# Patient Record
Sex: Female | Born: 1960 | Race: White | Hispanic: No | Marital: Married | Smoking: Former smoker
Health system: Southern US, Community
[De-identification: ages and names within clinical notes are randomized; demographics above are authoritative.]

## PROBLEM LIST (undated history)

## (undated) DIAGNOSIS — M722 Plantar fascial fibromatosis: Secondary | ICD-10-CM

## (undated) DIAGNOSIS — C50919 Malignant neoplasm of unspecified site of unspecified female breast: Secondary | ICD-10-CM

## (undated) DIAGNOSIS — K219 Gastro-esophageal reflux disease without esophagitis: Secondary | ICD-10-CM

## (undated) DIAGNOSIS — Z923 Personal history of irradiation: Secondary | ICD-10-CM

## (undated) DIAGNOSIS — M8580 Other specified disorders of bone density and structure, unspecified site: Secondary | ICD-10-CM

## (undated) DIAGNOSIS — M1990 Unspecified osteoarthritis, unspecified site: Secondary | ICD-10-CM

## (undated) DIAGNOSIS — K30 Functional dyspepsia: Secondary | ICD-10-CM

## (undated) HISTORY — PX: MOUTH SURGERY: SHX715

## (undated) HISTORY — DX: Malignant neoplasm of unspecified site of unspecified female breast: C50.919

## (undated) HISTORY — DX: Plantar fascial fibromatosis: M72.2

## (undated) HISTORY — DX: Unspecified osteoarthritis, unspecified site: M19.90

## (undated) HISTORY — DX: Functional dyspepsia: K30

---
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
## 1988-08-10 HISTORY — PX: GANGLION CYST EXCISION: SHX1691

## 1998-12-06 ENCOUNTER — Ambulatory Visit (HOSPITAL_COMMUNITY): Admission: RE | Admit: 1998-12-06 | Discharge: 1998-12-06 | Payer: Self-pay | Admitting: Family Medicine

## 1999-12-05 ENCOUNTER — Ambulatory Visit (HOSPITAL_COMMUNITY): Admission: RE | Admit: 1999-12-05 | Discharge: 1999-12-05 | Payer: Self-pay | Admitting: Family Medicine

## 1999-12-05 ENCOUNTER — Encounter: Payer: Self-pay | Admitting: Family Medicine

## 2008-05-02 DIAGNOSIS — D239 Other benign neoplasm of skin, unspecified: Secondary | ICD-10-CM

## 2008-05-02 HISTORY — DX: Other benign neoplasm of skin, unspecified: D23.9

## 2015-07-24 ENCOUNTER — Ambulatory Visit: Payer: Self-pay

## 2015-07-24 ENCOUNTER — Ambulatory Visit (INDEPENDENT_AMBULATORY_CARE_PROVIDER_SITE_OTHER): Payer: BLUE CROSS/BLUE SHIELD | Admitting: Podiatry

## 2015-07-24 ENCOUNTER — Encounter: Payer: Self-pay | Admitting: Podiatry

## 2015-07-24 ENCOUNTER — Ambulatory Visit (INDEPENDENT_AMBULATORY_CARE_PROVIDER_SITE_OTHER): Payer: BLUE CROSS/BLUE SHIELD

## 2015-07-24 VITALS — BP 124/70 | HR 67 | Resp 18

## 2015-07-24 DIAGNOSIS — M722 Plantar fascial fibromatosis: Secondary | ICD-10-CM

## 2015-07-24 DIAGNOSIS — Q6650 Congenital pes planus, unspecified foot: Secondary | ICD-10-CM | POA: Diagnosis not present

## 2015-07-24 DIAGNOSIS — R52 Pain, unspecified: Secondary | ICD-10-CM

## 2015-07-24 MED ORDER — METHYLPREDNISOLONE 4 MG PO TBPK: ORAL_TABLET | ORAL | Status: DC

## 2015-07-24 MED ORDER — MELOXICAM 15 MG PO TABS: 15.0000 mg | ORAL_TABLET | Freq: Every day | ORAL | Status: DC

## 2015-07-24 NOTE — Progress Notes (Signed)
   Subjective:    Patient ID: Norma Austin, female    DOB: May 26, 1961, 54 y.o.   MRN: [id number]   HPI I HAVE SOME HEEL PAIN ON BOTH OF MY FEET AND GOING ON FOR ABOUT A YEAR BUT WORSE LAST FEW MONTHS AND MY RIGHT ANKLE IS STIFF    Review of Systems  All other systems reviewed and are negative.      Objective:   Physical Exam: She presents today as a 54 year old female vital signs stable alert and oriented 3 no apparent distress. Pulses are strongly palpable. Neurologic sensorium is intact per Semmes-Weinstein monofilament. Deep tendon reflexes are intact bilateral and muscle strength is 5 over 5 dorsiflexion plantar flexors inverters and everters all into the musculature is intact. Orthopedic evaluation demonstrates all joints distal to the ankle for range of motion without crepitus moderate pes planus is noted bilateral. She has pain on palpation medial calcaneal tubercles of bilateral heels. Radiographs taken today 3 views of bilateral foot does demonstrate soft tissue increase in density at the plantar fascial calcaneal insertion site consistent with plantar fasciitis as well as pes planus. Cutaneous evaluation demonstrates supple well-hydrated cutis no erythema edema cellulitis drainage or odor.      Assessment & Plan:  Assessment: Plantar fasciitis and pes planus bilateral.  Plan: Discussed etiology pathology conservative versus surgical therapies. Injected the bilateral heels today with Kenalog and local anesthetic. Placed her in a plantar fascial brace as well as a night splint. Also started her on a Medrol Dosepak to be followed by meloxicam. And she was scanned for set of orthotics.

## 2015-08-30 ENCOUNTER — Ambulatory Visit (INDEPENDENT_AMBULATORY_CARE_PROVIDER_SITE_OTHER): Payer: BLUE CROSS/BLUE SHIELD | Admitting: *Deleted

## 2015-08-30 DIAGNOSIS — M722 Plantar fascial fibromatosis: Secondary | ICD-10-CM

## 2015-08-30 NOTE — Patient Instructions (Signed)
WEARING INSTRUCTIONS FOR ORTHOTICS  Don't expect to be comfortable wearing your orthotic devices for the first time.  Like eyeglasses, you may be aware of them as time passes, they will not be uncomfortable and you will enjoy wearing them.  FOLLOW THESE INSTRUCTIONS EXACTLY!  1. Wear your orthotic devices for:       Not more than 1 hour the first day.       Not more than 2 hours the second day.       Not more than 3 hours the third day and so on.        Or wear them for as long as they feel comfortable.       If you experience discomfort in your feet or legs take them out.  When feet & legs feel       better, put them back in.  You do need to be consistent and wear them a little        everyday. 2.   If at any time the orthotic devices become acutely uncomfortable before the       time for that particular day, STOP WEARING THEM. 3.   On the next day, do not increase the wearing time. 4.   Subsequently, increase the wearing time by 15-30 minutes only if comfortable to do       so. 5.   You will be seen by your doctor about 2-4 weeks after you receive your orthotic       devices, at which time you will probably be wearing your devices comfortably        for about 8 hours or more a day. 6.   Some patients occasionally report mild aches or discomfort in other parts of the of       body such as the knees, hips or back after 3 or 4 consecutive hours of wear.  If this       is the case with you, do not extend your wearing time.  Instead, cut it back an hour or       two.  In all likelihood, these symptoms will disappear in a short period of time as your       body posture realigns itself and functions more efficiently. 7.   It is possible that your orthotic device may require some small changes or adjustment       to improve their function or make them more comfortable.   This is usually not done       before one to three months have elapsed.  These adjustments are made in        accordance  with the changed position your feet are assuming as a result of       improved biomechanical function. 8.   In women's shoes, it's not unusual for your heel to slip out of the [place], particularly if       they are step-in-shoes.  If this is the case, try other shoes or other styles.  Try to       purchase shoes which have deeper heal seats or higher heel counters. 9.   Squeaking of orthotics devices in the shoes is due to the movement of the devices       when they are functioning normally.  To eliminate squeaking, simply dust some       baby powder into your shoes before inserting the devices.  If this does not work,          apply soap or wax to the edges of the orthotic devices or put a tissue into the shoes. 10. It is important that you follow these directions explicitly.  Failure to do so will simply       prolong the adjustment period or create problems which are easily avoided.  It makes       no difference if you are wearing your orthotic devices for only a few hours after        several months, so long as you are wearing them comfortably for those hours. 11. If you have any questions or complaints, contact our office.  We have no way of       knowing about your problems unless you tell us.  If we do not hear from you, we will       assume that you are proceeding well.  

## 2015-08-30 NOTE — Progress Notes (Signed)
Patient presents to pick up orthotics. Instructions for wear was reviewed and written copy was given. Follow up appointment will be made to see Dr. Milinda Pointer in 6 weeks.

## 2015-10-11 ENCOUNTER — Ambulatory Visit: Payer: BLUE CROSS/BLUE SHIELD | Admitting: Sports Medicine

## 2015-10-21 ENCOUNTER — Ambulatory Visit (INDEPENDENT_AMBULATORY_CARE_PROVIDER_SITE_OTHER): Payer: BLUE CROSS/BLUE SHIELD | Admitting: Podiatry

## 2015-10-21 ENCOUNTER — Encounter: Payer: Self-pay | Admitting: Podiatry

## 2015-10-21 VITALS — BP 136/82 | HR 72 | Resp 12

## 2015-10-21 DIAGNOSIS — M722 Plantar fascial fibromatosis: Secondary | ICD-10-CM | POA: Diagnosis not present

## 2015-10-21 NOTE — Progress Notes (Signed)
She presents today for follow-up of her orthotics. She states that she is still currently having heel pain and that the orthotics appear to be too narrow for her foot resulting in rubbing and irritation and skin breakdown.  Objective: Vital signs are stable she is alert and oriented 3. States that the pain in her heels is approximately 30% improved. She has pain on palpation medial calcaneal tubercles bilateral severe pes planus with narrow orthotics resulting and reactive hyperkeratotic lesion along the medial longitudinal arch. No open lesions and no skin breakdown.  Assessment: Pes planus with plantar fasciitis bilateral. Narrow orthotics resulting in callous medial longitudinal arch.  Plan: Debridement of reactive hyperkeratotic tissue today reinjected the bilateral heels for her. I also sent her orthotics out for an addition of a medial flange.

## 2016-08-10 DIAGNOSIS — C50919 Malignant neoplasm of unspecified site of unspecified female breast: Secondary | ICD-10-CM

## 2016-08-10 DIAGNOSIS — Z923 Personal history of irradiation: Secondary | ICD-10-CM

## 2016-08-10 HISTORY — PX: BREAST LUMPECTOMY: SHX2

## 2016-08-10 HISTORY — DX: Personal history of irradiation: Z92.3

## 2016-08-10 HISTORY — DX: Malignant neoplasm of unspecified site of unspecified female breast: C50.919

## 2016-12-30 ENCOUNTER — Other Ambulatory Visit: Payer: Self-pay | Admitting: Physician Assistant

## 2016-12-30 DIAGNOSIS — Z1231 Encounter for screening mammogram for malignant neoplasm of breast: Secondary | ICD-10-CM

## 2017-01-28 ENCOUNTER — Ambulatory Visit
Admission: RE | Admit: 2017-01-28 | Discharge: 2017-01-28 | Disposition: A | Discharge status: Home / Self Care | Payer: BLUE CROSS/BLUE SHIELD | Source: Ambulatory Visit | Attending: Physician Assistant | Admitting: Physician Assistant

## 2017-01-28 DIAGNOSIS — Z1231 Encounter for screening mammogram for malignant neoplasm of breast: Secondary | ICD-10-CM | POA: Diagnosis not present

## 2017-01-28 DIAGNOSIS — R928 Other abnormal and inconclusive findings on diagnostic imaging of breast: Secondary | ICD-10-CM | POA: Diagnosis not present

## 2017-02-04 ENCOUNTER — Other Ambulatory Visit: Payer: Self-pay | Admitting: Physician Assistant

## 2017-02-04 DIAGNOSIS — R928 Other abnormal and inconclusive findings on diagnostic imaging of breast: Secondary | ICD-10-CM

## 2017-02-04 DIAGNOSIS — N6320 Unspecified lump in the left breast, unspecified quadrant: Secondary | ICD-10-CM

## 2017-02-08 ENCOUNTER — Ambulatory Visit
Admission: RE | Admit: 2017-02-08 | Discharge: 2017-02-08 | Disposition: A | Discharge status: Home / Self Care | Payer: BLUE CROSS/BLUE SHIELD | Source: Ambulatory Visit | Attending: Physician Assistant | Admitting: Physician Assistant

## 2017-02-08 DIAGNOSIS — R928 Other abnormal and inconclusive findings on diagnostic imaging of breast: Secondary | ICD-10-CM

## 2017-02-08 DIAGNOSIS — N6320 Unspecified lump in the left breast, unspecified quadrant: Secondary | ICD-10-CM

## 2017-02-08 DIAGNOSIS — N6321 Unspecified lump in the left breast, upper outer quadrant: Secondary | ICD-10-CM | POA: Insufficient documentation

## 2017-02-11 ENCOUNTER — Other Ambulatory Visit: Payer: Self-pay | Admitting: Physician Assistant

## 2017-02-11 DIAGNOSIS — N6320 Unspecified lump in the left breast, unspecified quadrant: Secondary | ICD-10-CM

## 2017-02-11 DIAGNOSIS — R928 Other abnormal and inconclusive findings on diagnostic imaging of breast: Secondary | ICD-10-CM

## 2017-02-24 ENCOUNTER — Ambulatory Visit
Admission: RE | Admit: 2017-02-24 | Discharge: 2017-02-24 | Disposition: A | Discharge status: Home / Self Care | Payer: BLUE CROSS/BLUE SHIELD | Source: Ambulatory Visit | Attending: Physician Assistant | Admitting: Physician Assistant

## 2017-02-24 DIAGNOSIS — R928 Other abnormal and inconclusive findings on diagnostic imaging of breast: Secondary | ICD-10-CM

## 2017-02-24 DIAGNOSIS — N6320 Unspecified lump in the left breast, unspecified quadrant: Secondary | ICD-10-CM

## 2017-02-24 DIAGNOSIS — C50412 Malignant neoplasm of upper-outer quadrant of left female breast: Secondary | ICD-10-CM | POA: Insufficient documentation

## 2017-02-24 HISTORY — PX: BREAST BIOPSY: SHX20

## 2017-02-25 NOTE — Progress Notes (Signed)
  Oncology Nurse Navigator Documentation  Navigator Location: [facility] (02/25/17 1600)   )Navigator Encounter Type: Introductory phone call (02/25/17 1600)                     Patient Visit Type: Initial (02/25/17 1600)                              Time Spent with Patient: 15 (02/25/17 1600)  Phoned patient to introduce Navigation service.  Patient requests to be scheduled with Dr. Jamal Collin.  Office closed. Will schedule on Monday 03/01/17.  Request sent to schedulers for Oncology consult.

## 2017-03-01 NOTE — Progress Notes (Signed)
  Oncology Nurse Navigator Documentation  Navigator Location: [facility] (03/01/17 0900) Referral date to RadOnc/MedOnc: 03/03/17 (03/01/17 0900) )Navigator Encounter Type: Telephone (03/01/17 0900) Telephone: Lahoma Crocker Call;Appt Confirmation/Clarification (03/01/17 0900)                     Treatment Phase: Pre-Tx/Tx Discussion (03/01/17 0900) Barriers/Navigation Needs: Coordination of Care;Education (03/01/17 0900) Education: Accessing Care/ Finding Providers;Coping with Diagnosis/ Prognosis;Newly Diagnosed Cancer Education (03/01/17 0900)                        Time Spent with Patient: 15 (03/01/17 0900)   Confirmed Oncology Consult, and scheduled Surgical Consult with patient.  Both are scheduled 03/03/17.

## 2017-03-02 ENCOUNTER — Encounter: Payer: Self-pay | Admitting: *Deleted

## 2017-03-03 ENCOUNTER — Ambulatory Visit (INDEPENDENT_AMBULATORY_CARE_PROVIDER_SITE_OTHER): Payer: BLUE CROSS/BLUE SHIELD | Admitting: General Surgery

## 2017-03-03 ENCOUNTER — Inpatient Hospital Stay: Payer: BLUE CROSS/BLUE SHIELD | Attending: Oncology | Admitting: Oncology

## 2017-03-03 ENCOUNTER — Encounter: Payer: Self-pay | Admitting: Oncology

## 2017-03-03 ENCOUNTER — Inpatient Hospital Stay: Payer: BLUE CROSS/BLUE SHIELD

## 2017-03-03 ENCOUNTER — Encounter: Payer: Self-pay | Admitting: General Surgery

## 2017-03-03 VITALS — BP 130/84 | HR 66 | Temp 97.6°F | Wt 245.7 lb

## 2017-03-03 VITALS — BP 102/70 | HR 80 | Resp 14 | Ht 67.0 in | Wt 245.0 lb

## 2017-03-03 DIAGNOSIS — Z170 Estrogen receptor positive status [ER+]: Secondary | ICD-10-CM

## 2017-03-03 DIAGNOSIS — C50412 Malignant neoplasm of upper-outer quadrant of left female breast: Secondary | ICD-10-CM | POA: Diagnosis not present

## 2017-03-03 DIAGNOSIS — Z803 Family history of malignant neoplasm of breast: Secondary | ICD-10-CM | POA: Insufficient documentation

## 2017-03-03 DIAGNOSIS — K30 Functional dyspepsia: Secondary | ICD-10-CM | POA: Insufficient documentation

## 2017-03-03 DIAGNOSIS — Z79899 Other long term (current) drug therapy: Secondary | ICD-10-CM | POA: Diagnosis not present

## 2017-03-03 DIAGNOSIS — M129 Arthropathy, unspecified: Secondary | ICD-10-CM | POA: Diagnosis not present

## 2017-03-03 DIAGNOSIS — C50912 Malignant neoplasm of unspecified site of left female breast: Secondary | ICD-10-CM

## 2017-03-03 LAB — COMPREHENSIVE METABOLIC PANEL
ALBUMIN: 4.2 g/dL (ref 3.5–5.0)
ALT: 34 U/L (ref 14–54)
ANION GAP: 7 (ref 5–15)
AST: 23 U/L (ref 15–41)
Alkaline Phosphatase: 56 U/L (ref 38–126)
BUN: 13 mg/dL (ref 6–20)
CHLORIDE: 103 mmol/L (ref 101–111)
CO2: 24 mmol/L (ref 22–32)
Calcium: 9.2 mg/dL (ref 8.9–10.3)
Creatinine, Ser: 0.73 mg/dL (ref 0.44–1.00)
GFR calc non Af Amer: >60 mL/min (ref >60)
GLUCOSE: 98 mg/dL (ref 65–99)
Potassium: 4.2 mmol/L (ref 3.5–5.1)
SODIUM: 134 mmol/L — AB (ref 135–145)
Total Bilirubin: 0.6 mg/dL (ref 0.3–1.2)
Total Protein: 7.7 g/dL (ref 6.5–8.1)

## 2017-03-03 LAB — CBC WITH DIFFERENTIAL/PLATELET
Basophils Absolute: 0.1 10*3/uL (ref 0–0.1)
Basophils Relative: 2 %
Eosinophils Absolute: 0.1 10*3/uL (ref 0–0.7)
Eosinophils Relative: 2 %
HEMATOCRIT: 41.6 % (ref 35.0–47.0)
HEMOGLOBIN: 14.6 g/dL (ref 12.0–16.0)
LYMPHS ABS: 1.6 10*3/uL (ref 1.0–3.6)
Lymphocytes Relative: 30 %
MCH: 30.6 pg (ref 26.0–34.0)
MCHC: 35 g/dL (ref 32.0–36.0)
MCV: 87.3 fL (ref 80.0–100.0)
MONOS PCT: 7 %
Monocytes Absolute: 0.4 10*3/uL (ref 0.2–0.9)
NEUTROS ABS: 3.2 10*3/uL (ref 1.4–6.5)
NEUTROS PCT: 59 %
Platelets: 204 10*3/uL (ref 150–440)
RBC: 4.77 MIL/uL (ref 3.80–5.20)
RDW: 13.5 % (ref 11.5–14.5)
WBC: 5.3 10*3/uL (ref 3.6–11.0)

## 2017-03-03 NOTE — Progress Notes (Signed)
  Oncology Nurse Navigator Documentation  Navigator Location: [facility] (03/03/17 1500)   )Navigator Encounter Type: Initial MedOnc (03/03/17 1500)                       Treatment Phase: Pre-Tx/Tx Discussion (03/03/17 1500) Barriers/Navigation Needs: Education;Coordination of Care (03/03/17 1500) Education: Newly Diagnosed Cancer Education;Understanding Cancer/ Treatment Options (03/03/17 1500) Interventions: Education;Coordination of Care (03/03/17 1500)   Coordination of Care: Appts (03/03/17 1500) Education Method: Verbal;Written;Teach-back (03/03/17 1500)                Time Spent with Patient: 60 (03/03/17 1500)   Accompanied patient, wife to first Med/Onc visit with Dr. Tasia Catchings.  Given Breast Cancer Treatment Handbook/folder with hospital services.  Sent genetic testing to [facility].  She has appointment with Dr. Jamal Collin at 4:30 today.

## 2017-03-03 NOTE — Progress Notes (Signed)
Patient ID: Norma Austin, female   DOB: 03/10/1961, 56 y.o.   MRN: [id number]  Chief Complaint  Patient presents with  . Breast Problem    HPI Norma Austin is a 56 y.o. female.  who presents for a breast evaluation. The most recent mammogram with left breast biopsy was done on 02-24-17.  Patient does perform regular self breast checks but since she changed jobs had not had regular mammograms done.  She could not feel anything different in the breast. Mother with breast cancer at age 81. She is here with her significant other(wife) ,Norma Austin.   HPI  Past Medical History:  Diagnosis Date  . Arthritis   . Breast cancer (HCC) 2018   INVASIVE MAMMARY CARCINOMA  . Indigestion   . Plantar fasciitis     Past Surgical History:  Procedure Laterality Date  . BREAST BIOPSY Left 02/24/2017   INVASIVE MAMMARY CARCINOMA  . GANGLION CYST EXCISION  1990    Family History  Problem Relation Age of Onset  . Breast cancer Mother 81  . Lung cancer Father   . Thyroid cancer Cousin   . Colon cancer Neg Hx     Social History Social History  Substance Use Topics  . Smoking status: Never Smoker  . Smokeless tobacco: Never Used  . Alcohol use 0.0 oz/week     Comment: occasional    No Known Allergies  Current Outpatient Prescriptions  Medication Sig Dispense Refill  . diclofenac (VOLTAREN) 50 MG EC tablet Take 50 mg by mouth as needed.      No current facility-administered medications for this visit.     Review of Systems Review of Systems  Constitutional: Negative.   Respiratory: Negative.   Cardiovascular: Negative.     Blood pressure 102/70, pulse 80, resp. rate 14, height 5' 7" (1.702 m), weight 245 lb (111.1 kg).  Physical Exam Physical Exam  Constitutional: She is oriented to person, place, and time. She appears well-developed and well-nourished.  HENT:  Mouth/Throat: Oropharynx is clear and moist.  Eyes: Conjunctivae are normal. No scleral icterus.  Neck: Neck  supple.  Cardiovascular: Normal rate, regular rhythm and normal heart sounds.   Pulmonary/Chest: Effort normal and breath sounds normal. Right breast exhibits no inverted nipple, no mass, no nipple discharge, no skin change and no tenderness. Left breast exhibits no inverted nipple, no mass, no nipple discharge, no skin change and no tenderness. Breasts are symmetrical.    Abdominal: Soft. There is no tenderness.  Lymphadenopathy:    She has no cervical adenopathy.    She has no axillary adenopathy.  Neurological: She is alert and oriented to person, place, and time.  Skin: Skin is warm and dry.  Psychiatric: She has a normal mood and affect.    Data Reviewed  Mammogram and pathology 1.2 cm spiculated mass left breast UOQ. Atypical cyst in adjacent location disaapeared at time of core biopsy. 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 Genetic testing in progress. Discussed surgical options. Ideal for lumpectomy and SN biopsy.    No need for MRI Plan   Pt is agreeable to discussing surgical options further once HER2 results are available  Briefly explained procedure of lumpectomy and sentinel node biopsy.      HPI, Physical Exam, Assessment and Plan have been scribed under the direction and in the presence of S. G. Dalisha Shively, MD Marsha Hatch,   RN I have completed the exam and reviewed the above documentation for accuracy and completeness.  I agree with the above.  [employer] has been used and any errors in dictation or transcription are unintentional.  Navayah Sok G. Eldor Conaway, M.D., F.A.C.S.  Margan Elias G 03/05/2017, 9:10 AM   

## 2017-03-03 NOTE — Patient Instructions (Addendum)
Await HER2 results, will receive phone call  The patient is aware to call back for any questions or concerns.

## 2017-03-03 NOTE — Progress Notes (Signed)
[facility] Consultation Note  Patient Care Team: Sifferman, Grafton Folk as PCP - General (Physician Assistant) [name], MD as Referring Physician (Family Medicine) [name], MD (General Surgery)  CHIEF COMPLAINTS/PURPOSE OF CONSULTATION: I have breast cancer  HISTORY OF PRESENTING ILLNESS: Norma Austin 56 y.o. female with PMH listed as below is referred by Dr.Cook to cancer center for evaluation and management of newly diagnosed breast caner.  Patient had routine mammogram screening which showed left breast mass. This was followed by a diagnostic Mammogram and ultrasound which showed an hypoechoic mass measuring 12 x 7 x 10 mm at 1:30, 7 cm from the nipple, corresponding to the spiculated mass seen mammographically. There is an adjacent near anechoic mass at 1 o'clock, 6 cm from the nipple measuring 6 x 4 x 4 mm. 1:30 mass was biopsy and showed invasive carcinoma of breast, unspecified type, no LVI, No DCIS. The 1 o'clock mass disappeared after biopsy of the 1:30 mass. It maybe a cyst which was poked by the needle for anesthesia.   Today patient came with her significant other to discuss about diagnosis and plan. She has no complaints at this point.  Review of Systems  Constitutional: Negative.   HENT:  Negative.   Eyes: Negative.   Respiratory: Negative.   Cardiovascular: Negative.   Gastrointestinal: Negative.   Endocrine: Negative.   Genitourinary: Negative.    Musculoskeletal: Positive for arthralgias.  Skin: Negative.   Neurological: Negative.   Hematological: Negative.   Psychiatric/Behavioral: Negative.     MEDICAL HISTORY: Past Medical History:  Diagnosis Date  . Arthritis   . Breast cancer ([place])   . Indigestion   . Plantar fasciitis     SURGICAL HISTORY: Past Surgical History:  Procedure Laterality Date  . BREAST BIOPSY      SOCIAL HISTORY: Social History   Social History  . Marital status: Married    Spouse  name: N/A  . Number of children: N/A  . Years of education: N/A   Occupational History  . Not on file.   Social History Main Topics  . Smoking status: Never Smoker  . Smokeless tobacco: Never Used  . Alcohol use No  . Drug use: No  . Sexual activity: Not on file   Other Topics Concern  . Not on file   Social History Narrative  . No narrative on file    FAMILY HISTORY Family History  Problem Relation Age of Onset  . Breast cancer Mother 68    ALLERGIES:  has No Known Allergies.  MEDICATIONS:  Current Outpatient Prescriptions  Medication Sig Dispense Refill  . diclofenac (VOLTAREN) 50 MG EC tablet      No current facility-administered medications for this visit.     PHYSICAL EXAMINATION:  ECOG PERFORMANCE STATUS: 0 - Asymptomatic   Vitals:   03/03/17 1018  BP: 130/84  Pulse: 66  Temp: 97.6 F (36.4 C)    Filed Weights   03/03/17 1018  Weight: 245 lb 11.2 oz (111.4 kg)     Physical Exam GENERAL: No distress, well nourished.  SKIN:  No rashes or significant lesions  HEAD: Normocephalic, No masses, lesions, tenderness or abnormalities  EYES: Conjunctiva are pink, non icteric ENT: External ears normal ,lips , buccal mucosa, and tongue normal and mucous membranes are moist  LYMPH: No palpable cervical and axillary lymphadenopathy  LUNGS: Clear to auscultation, no crackles or wheezes HEART: Regular rate & rhythm, no murmurs, no gallops, S1 normal and  S2 normal  ABDOMEN: Abdomen soft, non-tender, normal bowel sounds, I did not appreciate any  masses or organomegaly  MUSCULOSKELETAL: No CVA tenderness and no tenderness on percussion of the back or rib cage.  EXTREMITIES: No edema, no skin discoloration or tenderness NEURO: Alert & oriented, no focal motor/sensory deficits. Breasts: Symmetric, left breast skin bruise from recent biopsy. Left upper outer quadrant mass (+),  no skin or nipple changes no palpable axillary lymph nodes.   LABORATORY DATA: I  have personally reviewed the data as listed: Pathology 7/18  Surgical Pathology   THIS IS AN ADDENDUM REPORT  CASE: [phone]  PATIENT: [facility]  Surgical Pathology Report  Reason for Addendum #1: Immunohistochemistry results   SPECIMEN SUBMITTED:  A. Breast, left, UOQ, 1:30   CLINICAL HISTORY:  Spiculated mass, 12 mm   PRE-OPERATIVE DIAGNOSIS:  Suspect IMC   POST-OPERATIVE DIAGNOSIS:  None provided.   DIAGNOSIS:  A. BREAST, LEFT, UPPER OUTER QUADRANT AT 1:30, 7 CM FROM NIPPLE;  ULTRASOUND-GUIDED CORE BIOPSY:  - INVASIVE MAMMARY CARCINOMA, NO SPECIAL TYPE.   Size of invasive carcinoma: 10 mm in this sample  Histologic grade of invasive carcinoma: Grade 1    Glandular/tubular differentiation score: 1    Nuclear pleomorphism score: 2    Mitotic rate score: 1    Total score: 4   Ductal carcinoma in situ: Not identified  Lymphovascular invasion: Not identified  ER/PR/HER2: Immunohistochemistry will be performed on block A1, with  reflex to [place] for HER2 2+. The results will be reported in an addendum.   ADDENDUM:  NOTE: HER2 immunohistochemistry result is equivocal (2+) and FISH  testing has been ordered. The result will be reported in an addendum.   BREAST BIOMARKER TEST RESULTS:  Estrogen Receptor (ER) Status: POSITIVE, >90% of cells with nuclear  positivity     Average intensity of staining: Strong  Progesterone Receptor (PgR) Status: POSITIVE, >90% of cells with nuclear  positivity     Average intensity of staining: Strong  HER2 (by immunohistochemistry): Equivocal (2+)  Percentage of cells with uniform intense complete membrane staining: 0%   METHODS  Cold Ischemia and Fixation Times: Meet requirements specified in latest  version of the ASCO/CAP guidelines  Testing Performed on Block Number(s): A1  Fixative: Formalin  Estrogen Receptor: FDA cleared ([place]); Primary Antibody:SP1  Progesterone Receptor: FDA cleared ([place]);  Primary Antibody: 1E2  HER2 (immunohistochemistry): FDA approved ([place]); HercepTest   Immunohistochemistry controls worked appropriately.  Slides were prepared by [facility] Molecular Biology and  Pathology, [place], [place], and interpreted by Dr. Dicie Beam.   Addendum #1 performed by Bryan Lemma, MD. Electronically signed  03/01/2017 7:53:17PM   RADIOGRAPHIC STUDIES: I have personally reviewed the radiological images as listed and agree with the findings in the report 01/28/2017 Mammogram screening FINDINGS: In the left breast, a possible mass warrants further evaluation. In the right breast, no findings suspicious for malignancy. IMPRESSION: Further evaluation is suggested for possible mass in the left breast. 02/08/2017 Mammogram diagnostic left IMPRESSION: 1. Highly suspicious left breast mass at 1:30, 7 cm from the nipple. 2. Indeterminate mass at 1 o'clock, 6 cm from the nipple.  02/08/2017 2D DIGITAL DIAGNOSTIC LEFT MAMMOGRAM WITH CAD AND ADJUNCT TOMO ULTRASOUND LEFT BREAST COMPARISON:  Previous exam(s). ACR Breast Density Category b: There are scattered areas of fibroglandular density. FINDINGS: Additional views of the left breast demonstrates a persistent spiculated mass in the upper, outer left breast, corresponding to the mass seen on screening mammogram. There is a smaller,  adjacent oval mass within the slightly more medial upper, outer left breast. Mammographic images were processed with CAD. On physical exam, there is palpable thickening at 1:30, 7 cm from the nipple in the area of concern. Ultrasound demonstrates an irregular, hypoechoic mass measuring 12 x 7 x 10 mm at 1:30, 7 cm from the nipple, corresponding to the spiculated mass seen mammographically. There is an adjacent near anechoic mass at 1 o'clock, 6 cm from the nipple measuring 6 x 4 x 4 mm, corresponding to the additional mass seen mammographically. Ultrasound of the left axilla demonstrates no suspicious  appearing axillary lymph nodes. IMPRESSION: 1. Highly suspicious left breast mass at 1:30, 7 cm from the nipple. 2. Indeterminate mass at 1 o'clock, 6 cm from the nipple. EXAM: ULTRASOUND GUIDED LEFT BREAST CORE NEEDLE BIOPSY COMPARISON:  Previous exam(s). FINDINGS: I met with the patient and we discussed the procedure of ultrasound-guided biopsy, including benefits and alternatives. We discussed the high likelihood of a successful procedure. We discussed the risks of the procedure, including infection, bleeding, tissue injury, clip migration, and inadequate sampling. Informed written consent was given. The usual time-out protocol was performed immediately prior to the procedure.  Lesion quadrant: Upper outer quadrant  Using sterile technique and 1% Lidocaine as local anesthetic, under direct ultrasound visualization, a 12 gauge spring-loaded device was used to perform biopsy of the highly suspicious mass in the 1:30 o'clock position using an inferior approach. At the conclusion of the procedure a coil shaped tissue marker clip was deployed into the biopsy cavity. Follow up 2 view mammogram was performed and dictated separately.  Following the local anesthesia, the indeterminate mass, that was localized at the 1 o'clock position, could no longer be visualized. This appears to have been disrupted with the local anesthesia needle or possibly obscured by the infiltrated lidocaine. This strongly supports this lesion being benign cyst or dilated duct. IMPRESSION: Ultrasound guided biopsy of a left breast mass. No apparent Complications.  ASSESSMENT/PLAN Cancer Staging Malignant neoplasm of upper-outer quadrant of left breast in female, estrogen receptor positive ([place]) Staging form: Breast, AJCC 8th Edition - Clinical stage from 03/03/2017: Stage IA (cT1c, cN0, cM0, G1, ER: Positive, PR: Positive, HER2: Equivocal) - Signed by Earlie Server, MD on 03/03/2017  1. Malignant neoplasm of  upper-outer quadrant of left breast in female, estrogen receptor positive ([place])   2. Er+ PR+ carcinoma of breast, left (HCC)            HER2 IHC equivocal.  # Clinically patient has Stage IA breast cancer. Discussed with patient and her significant other about the cancer diagnosis.  Awaiting HER2 FISH to finalize treatment plan with adjustment after her pathology from resection. .   Likely lumpectomy with sentinel lymph node biopsy + RT, + adjuvant AI +/- adjuvant chemotherapy.  She has positive family history of breast cancer, including her mother with hormone receptor positive breast cancer at age of 18, Aunt questionable breast cancer, mother's aunt has breast surgery for questionable breast cancer. In addition, one maternal cousin with thyroid cancer. Will send genetic test and she may need genetic counseling if test positive. Prefer to know these information pre-OP as it may change surgical decision.   # The second mass disappear US guided biopsy for the first mass, questionable cyst. I am leaning towards obtaining MRI breast to further evaluate the second mass, and also if any other multifocal masses. Appreciate input from Dr.Sankar regarding the need of MRI.    Orders Placed This Encounter  Procedures  . Comprehensive metabolic panel    Standing Status:   Future    Standing Expiration Date:   03/03/2018  . CBC with Differential/Platelet    Standing Status:   Future    Standing Expiration Date:   03/03/2018    All questions were answered. The patient knows to call the clinic with any problems, questions or concerns. Thank you for this kind referral and the opportunity to participate in the care of this patient. A copy of today's note will be routed to referring physician Dr Lacinda Axon. Return after surgery.    Earlie Server MD PhD [facility] On[facility] [phone] Phone: [phone] 02/22/2017

## 2017-03-05 LAB — SURGICAL PATHOLOGY

## 2017-03-08 ENCOUNTER — Telehealth: Payer: Self-pay | Admitting: *Deleted

## 2017-03-08 ENCOUNTER — Other Ambulatory Visit: Payer: Self-pay | Admitting: *Deleted

## 2017-03-08 DIAGNOSIS — C50412 Malignant neoplasm of upper-outer quadrant of left female breast: Secondary | ICD-10-CM

## 2017-03-08 DIAGNOSIS — Z170 Estrogen receptor positive status [ER+]: Principal | ICD-10-CM

## 2017-03-08 NOTE — Telephone Encounter (Signed)
Message left for patient to call the office.   Per Dr. Jamal Collin, he was waiting for the [facility] report to come back and it is okay.   He would like to discuss this with the patient as well as a lumpectomy. We need to see if patient can come in on Tuesday, 03-09-17 or Thursday, 03-11-17 to further discuss.   Patient's surgery date can go ahead and be arranged if patient wants to go ahead and get it scheduled.

## 2017-03-08 NOTE — Telephone Encounter (Signed)
Patient called back and was notified as instructed.   Pre-op visit has been scheduled for this Thursday as she already has the day off work.   Patient's surgery has been scheduled for 03-18-17 at [facility]. We will review instructions at patient's pre-op visit on Thursday. She verbalizes understanding.

## 2017-03-10 HISTORY — PX: BREAST MAMMOSITE: SHX5264

## 2017-03-11 ENCOUNTER — Encounter: Payer: Self-pay | Admitting: General Surgery

## 2017-03-11 ENCOUNTER — Ambulatory Visit (INDEPENDENT_AMBULATORY_CARE_PROVIDER_SITE_OTHER): Payer: BLUE CROSS/BLUE SHIELD | Admitting: General Surgery

## 2017-03-11 ENCOUNTER — Other Ambulatory Visit: Payer: Self-pay | Admitting: *Deleted

## 2017-03-11 ENCOUNTER — Encounter
Admission: RE | Admit: 2017-03-11 | Discharge: 2017-03-11 | Disposition: A | Discharge status: Home / Self Care | Payer: BLUE CROSS/BLUE SHIELD | Source: Ambulatory Visit | Attending: General Surgery | Admitting: General Surgery

## 2017-03-11 VITALS — BP 118/78 | HR 78 | Resp 12 | Ht 67.0 in | Wt 246.0 lb

## 2017-03-11 DIAGNOSIS — C50412 Malignant neoplasm of upper-outer quadrant of left female breast: Secondary | ICD-10-CM | POA: Diagnosis not present

## 2017-03-11 DIAGNOSIS — Z170 Estrogen receptor positive status [ER+]: Secondary | ICD-10-CM

## 2017-03-11 HISTORY — DX: Gastro-esophageal reflux disease without esophagitis: K21.9

## 2017-03-11 NOTE — Patient Instructions (Signed)
  Your procedure is scheduled on: 03-18-17 Report to [facility] (2ND DESK ON RIGHT)  Remember: Instructions that are not followed completely may result in serious medical risk, up to and including death, or upon the discretion of your surgeon and anesthesiologist your surgery may need to be rescheduled.    _x___ 1. Do not eat food or drink liquids after midnight. No gum chewing or  hard candies.     __x__ 2. No Alcohol for 24 hours before or after surgery.   __x__3. No Smoking for 24 prior to surgery.   ____  4. Bring all medications with you on the day of surgery if instructed.    __x__ 5. Notify your doctor if there is any change in your medical condition     (cold, fever, infections).     Do not wear jewelry, make-up, hairpins, clips or nail polish.  Do not wear lotions, powders, or perfumes. You may wear deodorant.  Do not shave 48 hours prior to surgery. Men may shave face and neck.  Do not bring valuables to the hospital.    [facility] is not responsible for any belongings or valuables.               Contacts, dentures or bridgework may not be worn into surgery.  Leave your suitcase in the car. After surgery it may be brought to your room.  For patients admitted to the hospital, discharge time is determined by your                       treatment team.   Patients discharged the day of surgery will not be allowed to drive home.  You will need someone to drive you home and stay with you the night of your procedure.    Please read over the following fact sheets that you were given:   [facility] Preparing for Surgery and or MRSA Information   _x___ Take anti-hypertensive (unless it includes a diuretic), cardiac, seizure, asthma,     anti-reflux and psychiatric medicines. These include:  1. ZANTAC  2. TAKE A ZANTAC ON Wednesday NIGHT BEFORE BED  3.  4.  5.  6.  ____Fleets enema or Magnesium Citrate as directed.   ____ Use CHG Soap or sage wipes as directed  on instruction sheet   ____ Use inhalers on the day of surgery and bring to hospital day of surgery  ____ Stop Metformin and Janumet 2 days prior to surgery.    ____ Take 1/2 of usual insulin dose the night before surgery and none on the morning surgery.   ____ Follow recommendations from Cardiologist, Pulmonologist or PCP regarding stopping Aspirin, Coumadin, Pllavix ,Eliquis, Effient, or Pradaxa, and Pletal.  X____Stop Anti-inflammatories such as Advil, Aleve, Ibuprofen, Motrin, Naproxen, DICLOFENAC, Naprosyn, Goodies powders or aspirin products NOW-OK to take Tylenol    ____ Stop supplements until after surgery.     ____ Bring C-Pap to the hospital.

## 2017-03-11 NOTE — Progress Notes (Signed)
Patient ID: Norma Austin, female   DOB: 05/14/1961, 56 y.o.   MRN: [id number]  Chief Complaint  Patient presents with  . Pre-op Exam    HPI Norma Austin is a 56 y.o. female here today for her pre op left lumpectomy with SLN biopsy scheduled for 03/18/2017.  HPI  Past Medical History:  Diagnosis Date  . Arthritis   . Breast cancer ([place]) 2018   INVASIVE MAMMARY CARCINOMA  . GERD (gastroesophageal reflux disease)   . Indigestion   . Plantar fasciitis     Past Surgical History:  Procedure Laterality Date  . BREAST BIOPSY Left 02/24/2017   INVASIVE MAMMARY CARCINOMA  . GANGLION CYST EXCISION  1990  . MOUTH SURGERY     4 BOTTOM FRONT DENTAL IMPLANTS    Family History  Problem Relation Age of Onset  . Breast cancer Mother 76  . Lung cancer Father   . Thyroid cancer Cousin   . Colon cancer Neg Hx     Social History Social History  Substance Use Topics  . Smoking status: Former Smoker    Packs/day: 1.00    Years: 25.00    Types: Cigarettes    Quit date: 03/12/2007  . Smokeless tobacco: Never Used  . Alcohol use 0.0 oz/week     Comment: occasional    Allergies  Allergen Reactions  . Adhesive [Tape]     WHELPS    Current Outpatient Prescriptions  Medication Sig Dispense Refill  . acetaminophen (TYLENOL) 500 MG tablet Take 1,000 mg by mouth every 6 (six) hours as needed (for pain.).    Marland Kitchen diclofenac (VOLTAREN) 50 MG EC tablet Take 50 mg by mouth 2 (two) times daily as needed (for plantar fasciitis/arthritis.).     Marland Kitchen ranitidine (ZANTAC) 150 MG tablet Take 150 mg by mouth as needed for heartburn.     No current facility-administered medications for this visit.     Review of Systems Review of Systems  Constitutional: Negative.   Respiratory: Negative.   Cardiovascular: Negative.     Blood pressure 118/78, pulse 78, resp. rate 12, height 5\' 7"  (1.702 m), weight 246 lb (111.6 kg).  Physical Exam Physical Exam  Constitutional: She is oriented to person, place,  and time. She appears well-developed and well-nourished.  Eyes: Conjunctivae are normal. No scleral icterus.  Pulmonary/Chest:    Lymphadenopathy:    She has no axillary adenopathy.  Neurological: She is alert and oriented to person, place, and time.  Skin: Skin is warm and dry.  Psychiatric: She has a normal mood and affect.    Data Reviewed Pathology Assessment   Invasive mammary carcinoma, ER/PR ps, her 2 not amplified. Clinical T1c,N0  Plan   Pt advised fully on procedure of lumpectomy, sentinel node biopsy. Final staging will be based on the pathology reports. Role of radiation possible chemotherapy and possibly obtaining Mammoprint were briefly explained today.   Patient is scheduled for left lumpectomy with SLN biopsy on 03/18/2017.    HPI, Physical Exam, Assessment and Plan have been scribed under the direction and in the presence of Mckinley Jewel, MD  Concepcion Living, LPN I have completed the exam and reviewed the above documentation for accuracy and completeness.  I agree with the above.  Haematologist has been used and any errors in dictation or transcription are unintentional.  Saren Corkern G. Jamal Collin, M.D., F.A.C.S.   Junie Panning G 03/11/2017, 3:29 PM

## 2017-03-17 ENCOUNTER — Encounter: Payer: Self-pay | Admitting: Diagnostic Radiology

## 2017-03-18 ENCOUNTER — Ambulatory Visit
Admission: RE | Admit: 2017-03-18 | Discharge: 2017-03-18 | Disposition: A | Discharge status: Home / Self Care | Payer: BLUE CROSS/BLUE SHIELD | Source: Ambulatory Visit | Attending: General Surgery | Admitting: General Surgery

## 2017-03-18 ENCOUNTER — Ambulatory Visit: Payer: BLUE CROSS/BLUE SHIELD | Admitting: Anesthesiology

## 2017-03-18 ENCOUNTER — Encounter: Payer: Self-pay | Admitting: *Deleted

## 2017-03-18 ENCOUNTER — Ambulatory Visit: Payer: BLUE CROSS/BLUE SHIELD

## 2017-03-18 ENCOUNTER — Encounter
Admission: RE | Admit: 2017-03-18 | Discharge: 2017-03-18 | Disposition: A | Discharge status: Home / Self Care | Payer: BLUE CROSS/BLUE SHIELD | Source: Ambulatory Visit | Attending: General Surgery | Admitting: General Surgery

## 2017-03-18 ENCOUNTER — Encounter
Admission: RE | Disposition: A | Discharge status: Home / Self Care | Payer: Self-pay | Source: Ambulatory Visit | Attending: General Surgery

## 2017-03-18 DIAGNOSIS — Z170 Estrogen receptor positive status [ER+]: Secondary | ICD-10-CM

## 2017-03-18 DIAGNOSIS — Z79899 Other long term (current) drug therapy: Secondary | ICD-10-CM | POA: Insufficient documentation

## 2017-03-18 DIAGNOSIS — Z803 Family history of malignant neoplasm of breast: Secondary | ICD-10-CM | POA: Insufficient documentation

## 2017-03-18 DIAGNOSIS — C50412 Malignant neoplasm of upper-outer quadrant of left female breast: Secondary | ICD-10-CM | POA: Insufficient documentation

## 2017-03-18 DIAGNOSIS — N6002 Solitary cyst of left breast: Secondary | ICD-10-CM | POA: Diagnosis not present

## 2017-03-18 DIAGNOSIS — Z791 Long term (current) use of non-steroidal anti-inflammatories (NSAID): Secondary | ICD-10-CM | POA: Insufficient documentation

## 2017-03-18 DIAGNOSIS — Z87891 Personal history of nicotine dependence: Secondary | ICD-10-CM | POA: Insufficient documentation

## 2017-03-18 DIAGNOSIS — K219 Gastro-esophageal reflux disease without esophagitis: Secondary | ICD-10-CM | POA: Diagnosis not present

## 2017-03-18 DIAGNOSIS — R928 Other abnormal and inconclusive findings on diagnostic imaging of breast: Secondary | ICD-10-CM

## 2017-03-18 HISTORY — PX: BREAST LUMPECTOMY WITH SENTINEL LYMPH NODE BIOPSY: SHX5597

## 2017-03-18 SURGERY — BREAST LUMPECTOMY WITH SENTINEL LYMPH NODE BX
Anesthesia: General | Site: Breast | Laterality: Left | Wound class: Clean

## 2017-03-18 MED ORDER — FENTANYL CITRATE (PF) 100 MCG/2ML IJ SOLN
INTRAMUSCULAR | Status: AC
  Filled 2017-03-18: qty 2

## 2017-03-18 MED ORDER — FENTANYL CITRATE (PF) 100 MCG/2ML IJ SOLN: 25.0000 ug | INTRAMUSCULAR | Status: DC | PRN

## 2017-03-18 MED ORDER — ONDANSETRON HCL 4 MG/2ML IJ SOLN
INTRAMUSCULAR | Status: DC | PRN
  Administered 2017-03-18: 4 mg via INTRAVENOUS

## 2017-03-18 MED ORDER — PROMETHAZINE HCL 25 MG/ML IJ SOLN: 6.2500 mg | INTRAMUSCULAR | Status: DC | PRN

## 2017-03-18 MED ORDER — TRAMADOL HCL 50 MG PO TABS
50.0000 mg | ORAL_TABLET | Freq: Four times a day (QID) | ORAL | Status: DC | PRN
  Administered 2017-03-18: 50 mg via ORAL
  Filled 2017-03-18: qty 1

## 2017-03-18 MED ORDER — BUPIVACAINE HCL (PF) 0.5 % IJ SOLN
INTRAMUSCULAR | Status: DC | PRN
  Administered 2017-03-18: 30 mL

## 2017-03-18 MED ORDER — MIDAZOLAM HCL 2 MG/2ML IJ SOLN
INTRAMUSCULAR | Status: AC
  Filled 2017-03-18: qty 2

## 2017-03-18 MED ORDER — TECHNETIUM TC 99M SULFUR COLLOID FILTERED
1.0000 | Freq: Once | INTRAVENOUS | Status: AC | PRN
  Administered 2017-03-18: 0.791 via INTRADERMAL

## 2017-03-18 MED ORDER — METHYLENE BLUE 0.5 % INJ SOLN
INTRAVENOUS | Status: AC
  Filled 2017-03-18: qty 10

## 2017-03-18 MED ORDER — LACTATED RINGERS IV SOLN
INTRAVENOUS | Status: DC
  Administered 2017-03-18: 10:00:00 via INTRAVENOUS

## 2017-03-18 MED ORDER — DEXAMETHASONE SODIUM PHOSPHATE 10 MG/ML IJ SOLN
INTRAMUSCULAR | Status: AC
  Filled 2017-03-18: qty 1

## 2017-03-18 MED ORDER — CEFAZOLIN SODIUM-DEXTROSE 2-4 GM/100ML-% IV SOLN
2.0000 g | INTRAVENOUS | Status: AC
  Administered 2017-03-18: 2 g via INTRAVENOUS

## 2017-03-18 MED ORDER — BUPIVACAINE HCL (PF) 0.5 % IJ SOLN
INTRAMUSCULAR | Status: AC
  Filled 2017-03-18: qty 30

## 2017-03-18 MED ORDER — TRAMADOL HCL 50 MG PO TABS
ORAL_TABLET | ORAL | Status: AC
  Filled 2017-03-18: qty 1

## 2017-03-18 MED ORDER — ONDANSETRON HCL 4 MG/2ML IJ SOLN
INTRAMUSCULAR | Status: AC
  Filled 2017-03-18: qty 2

## 2017-03-18 MED ORDER — DEXAMETHASONE SODIUM PHOSPHATE 10 MG/ML IJ SOLN
INTRAMUSCULAR | Status: DC | PRN
  Administered 2017-03-18: 10 mg via INTRAVENOUS

## 2017-03-18 MED ORDER — PHENYLEPHRINE HCL 10 MG/ML IJ SOLN
INTRAMUSCULAR | Status: DC | PRN
  Administered 2017-03-18: 50 ug via INTRAVENOUS

## 2017-03-18 MED ORDER — METHYLENE BLUE 0.5 % INJ SOLN
INTRAVENOUS | Status: DC | PRN
  Administered 2017-03-18: 5 mL via SUBMUCOSAL

## 2017-03-18 MED ORDER — KETOROLAC TROMETHAMINE 30 MG/ML IJ SOLN
INTRAMUSCULAR | Status: DC | PRN
  Administered 2017-03-18: 30 mg via INTRAVENOUS

## 2017-03-18 MED ORDER — PROPOFOL 10 MG/ML IV BOLUS
INTRAVENOUS | Status: AC
Start: 2017-03-18 — End: ?
  Filled 2017-03-18: qty 20

## 2017-03-18 MED ORDER — MIDAZOLAM HCL 2 MG/2ML IJ SOLN
INTRAMUSCULAR | Status: DC | PRN
  Administered 2017-03-18: 2 mg via INTRAVENOUS

## 2017-03-18 MED ORDER — FENTANYL CITRATE (PF) 100 MCG/2ML IJ SOLN
INTRAMUSCULAR | Status: DC | PRN
  Administered 2017-03-18: 50 ug via INTRAVENOUS
  Administered 2017-03-18 (×2): 25 ug via INTRAVENOUS

## 2017-03-18 MED ORDER — SODIUM CHLORIDE 0.9 % IJ SOLN
INTRAMUSCULAR | Status: AC
  Filled 2017-03-18: qty 10

## 2017-03-18 MED ORDER — CEFAZOLIN SODIUM-DEXTROSE 2-4 GM/100ML-% IV SOLN
INTRAVENOUS | Status: AC
  Filled 2017-03-18: qty 100

## 2017-03-18 MED ORDER — CHLORHEXIDINE GLUCONATE CLOTH 2 % EX PADS: 6.0000 | MEDICATED_PAD | Freq: Once | CUTANEOUS | Status: DC

## 2017-03-18 MED ORDER — TRAMADOL HCL 50 MG PO TABS: 50.0000 mg | ORAL_TABLET | Freq: Four times a day (QID) | ORAL | 0 refills | Status: DC | PRN

## 2017-03-18 SURGICAL SUPPLY — 48 items
ADH SKN CLS APL DERMABOND .7 (GAUZE/BANDAGES/DRESSINGS) ×1
BLADE SURG 15 STRL SS SAFETY (BLADE) ×6 IMPLANT
BULB RESERV EVAC DRAIN JP 100C (MISCELLANEOUS) ×1 IMPLANT
CANISTER SUCT 1200ML W/VALVE (MISCELLANEOUS) ×3 IMPLANT
CHLORAPREP W/TINT 26ML (MISCELLANEOUS) ×3 IMPLANT
CLOSURE WOUND 1/2 X4 (GAUZE/BANDAGES/DRESSINGS)
CNTNR SPEC 2.5X3XGRAD LEK (MISCELLANEOUS) ×1
CONT SPEC 4OZ STER OR WHT (MISCELLANEOUS) ×2
CONT SPEC 4OZ STRL OR WHT (MISCELLANEOUS) ×1
CONTAINER SPEC 2.5X3XGRAD LEK (MISCELLANEOUS) ×4 IMPLANT
COVER PROBE FLX POLY STRL (MISCELLANEOUS) ×3 IMPLANT
DECANTER SPIKE VIAL GLASS SM (MISCELLANEOUS) ×1 IMPLANT
DERMABOND ADVANCED (GAUZE/BANDAGES/DRESSINGS) ×2
DERMABOND ADVANCED .7 DNX12 (GAUZE/BANDAGES/DRESSINGS) ×1 IMPLANT
DEVICE DUBIN SPECIMEN MAMMOGRA (MISCELLANEOUS) ×3 IMPLANT
DEVICE LOCALIZATION ULTRAWIRE (WIRE) ×1 IMPLANT
DRAIN CHANNEL JP 15F RND 16 (MISCELLANEOUS) ×1 IMPLANT
DRAPE LAPAROTOMY TRNSV 106X77 (MISCELLANEOUS) ×3 IMPLANT
ELECT CAUTERY BLADE TIP 2.5 (TIP) ×3
ELECT REM PT RETURN 9FT ADLT (ELECTROSURGICAL) ×3
ELECTRODE CAUTERY BLDE TIP 2.5 (TIP) ×1 IMPLANT
ELECTRODE REM PT RTRN 9FT ADLT (ELECTROSURGICAL) ×1 IMPLANT
GLOVE BIO SURGEON STRL SZ7 (GLOVE) ×7 IMPLANT
GLOVE SURG SYN 6.5 ES PF (GLOVE) ×12 IMPLANT
GLOVE SURG SYN 6.5 PF PI (GLOVE) IMPLANT
GOWN STRL REUS W/ TWL LRG LVL3 (GOWN DISPOSABLE) ×3 IMPLANT
GOWN STRL REUS W/TWL LRG LVL3 (GOWN DISPOSABLE) ×9
KIT RM TURNOVER STRD PROC AR (KITS) ×3 IMPLANT
LABEL OR SOLS (LABEL) ×3 IMPLANT
MARGIN MAP 10MM (MISCELLANEOUS) ×3 IMPLANT
NDL FILTER BLUNT 18X1 1/2 (NEEDLE) IMPLANT
NDL HYPO 25X1 1.5 SAFETY (NEEDLE) ×1 IMPLANT
NDL SAFETY 22GX1.5 (NEEDLE) ×3 IMPLANT
NEEDLE FILTER BLUNT 18X 1/2SAF (NEEDLE) ×4
NEEDLE FILTER BLUNT 18X1 1/2 (NEEDLE) ×2 IMPLANT
NEEDLE HYPO 25X1 1.5 SAFETY (NEEDLE) ×6 IMPLANT
PACK BASIN MINOR ARMC (MISCELLANEOUS) ×3 IMPLANT
SHEARS HARMONIC 9CM CVD (BLADE) ×1 IMPLANT
SLEVE PROBE SENORX GAMMA FIND (MISCELLANEOUS) ×3 IMPLANT
STRIP CLOSURE SKIN 1/2X4 (GAUZE/BANDAGES/DRESSINGS) ×1 IMPLANT
SUT ETH BLK MONO 3 0 FS 1 12/B (SUTURE) ×4 IMPLANT
SUT MNCRL AB 3-0 PS2 27 (SUTURE) ×3 IMPLANT
SUT VIC AB 2-0 BRD 54 (SUTURE) ×1 IMPLANT
SUT VIC AB 2-0 CT2 27 (SUTURE) ×6 IMPLANT
SYR 10ML LL (SYRINGE) ×2 IMPLANT
SYRINGE 10CC LL (SYRINGE) ×3 IMPLANT
ULTRAWIRE LOCALIZATION DEVICE (WIRE) ×3
WATER STERILE IRR 1000ML POUR (IV SOLUTION) ×3 IMPLANT

## 2017-03-18 NOTE — H&P (View-Only) (Signed)
Patient ID: Norma Austin, female   DOB: February 04, 1961, 56 y.o.   MRN: [id number]  Chief Complaint  Patient presents with  . Breast Problem    HPI Norma Austin is a 56 y.o. female.  who presents for a breast evaluation. The most recent mammogram with left breast biopsy was done on 02-24-17.  Patient does perform regular self breast checks but since she changed jobs had not had regular mammograms done.  She could not feel anything different in the breast. Mother with breast cancer at age 56. She is here with her significant other(wife) ,Norma Austin.   HPI  Past Medical History:  Diagnosis Date  . Arthritis   . Breast cancer ([place]) 2018   INVASIVE MAMMARY CARCINOMA  . Indigestion   . Plantar fasciitis     Past Surgical History:  Procedure Laterality Date  . BREAST BIOPSY Left 02/24/2017   INVASIVE MAMMARY CARCINOMA  . GANGLION CYST EXCISION  1990    Family History  Problem Relation Age of Onset  . Breast cancer Mother 56  . Lung cancer Father   . Thyroid cancer Cousin   . Colon cancer Neg Hx     Social History Social History  Substance Use Topics  . Smoking status: Never Smoker  . Smokeless tobacco: Never Used  . Alcohol use 0.0 oz/week     Comment: occasional    No Known Allergies  Current Outpatient Prescriptions  Medication Sig Dispense Refill  . diclofenac (VOLTAREN) 50 MG EC tablet Take 50 mg by mouth as needed.      No current facility-administered medications for this visit.     Review of Systems Review of Systems  Constitutional: Negative.   Respiratory: Negative.   Cardiovascular: Negative.     Blood pressure 102/70, pulse 80, resp. rate 14, height 5' 7" (1.702 m), weight 245 lb (111.1 kg).  Physical Exam Physical Exam  Constitutional: She is oriented to person, place, and time. She appears well-developed and well-nourished.  HENT:  Mouth/Throat: Oropharynx is clear and moist.  Eyes: Conjunctivae are normal. No scleral icterus.  Neck: Neck  supple.  Cardiovascular: Normal rate, regular rhythm and normal heart sounds.   Pulmonary/Chest: Effort normal and breath sounds normal. Right breast exhibits no inverted nipple, no mass, no nipple discharge, no skin change and no tenderness. Left breast exhibits no inverted nipple, no mass, no nipple discharge, no skin change and no tenderness. Breasts are symmetrical.    Abdominal: Soft. There is no tenderness.  Lymphadenopathy:    She has no cervical adenopathy.    She has no axillary adenopathy.  Neurological: She is alert and oriented to person, place, and time.  Skin: Skin is warm and dry.  Psychiatric: She has a normal mood and affect.    Data Reviewed  Mammogram and pathology 1.2 cm spiculated mass left breast UOQ. Atypical cyst in adjacent location disaapeared at time of core biopsy. 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 Genetic testing in progress. Discussed surgical options. Ideal for lumpectomy and SN biopsy.    No need for MRI Plan   Pt is agreeable to discussing surgical options further once HER2 results are available  Briefly explained procedure of lumpectomy and sentinel node biopsy.      HPI, Physical Exam, Assessment and Plan have been scribed under the direction and in the presence of Mckinley Jewel, MD Karie Fetch,  RN I have completed the exam and reviewed the above documentation for accuracy and completeness.  I agree with the above.  Haematologist has been used and any errors in dictation or transcription are unintentional.  Seeplaputhur G. Jamal Collin, M.D., F.A.C.S.  Junie Panning G 03/05/2017, 9:10 AM

## 2017-03-18 NOTE — Transfer of Care (Signed)
Immediate Anesthesia Transfer of Care Note  Patient: Norma Austin  Procedure(s) Performed: Procedure(s): BREAST LUMPECTOMY WITH SENTINEL LYMPH NODE BX (Left)  Patient Location: PACU  Anesthesia Type:General  Level of Consciousness: sedated  Airway & Oxygen Therapy: Patient Spontanous Breathing and Patient connected to face mask oxygen  Post-op Assessment: Report given to RN and Post -op Vital signs reviewed and stable  Post vital signs: Reviewed and stable  Last Vitals:  Vitals:   03/18/17 0909  BP: 137/71  Pulse: 60  Resp: 16  Temp: 36.6 C  SpO2: 96%    Last Pain:  Vitals:   03/18/17 0909  TempSrc: Oral  PainSc: 1          Complications: No apparent anesthesia complications

## 2017-03-18 NOTE — Anesthesia Preprocedure Evaluation (Signed)
Anesthesia Evaluation  Patient identified by MRN, date of birth, ID band Patient awake    Reviewed: Allergy & Precautions, H&P , NPO status , Patient's Chart, lab work & pertinent test results, reviewed documented beta blocker date and time   History of Anesthesia Complications Negative for: history of anesthetic complications  Airway Mallampati: III  TM Distance: >3 FB Neck ROM: full    Dental  (+) Implants, Dental Advidsory Given, Teeth Intact   Pulmonary neg pulmonary ROS, former smoker,           Cardiovascular Exercise Tolerance: Good negative cardio ROS       Neuro/Psych negative neurological ROS  negative psych ROS   GI/Hepatic Neg liver ROS, GERD  ,  Endo/Other  negative endocrine ROS  Renal/GU negative Renal ROS  negative genitourinary   Musculoskeletal   Abdominal   Peds  Hematology negative hematology ROS (+)   Anesthesia Other Findings Past Medical History: No date: Arthritis 2018: Breast cancer ([place])     Comment:  INVASIVE MAMMARY CARCINOMA No date: GERD (gastroesophageal reflux disease) No date: Indigestion No date: Plantar fasciitis   Reproductive/Obstetrics negative OB ROS                             Anesthesia Physical Anesthesia Plan  ASA: II  Anesthesia Plan: General   Post-op Pain Management:    Induction: Intravenous  PONV Risk Score and Plan: 3 and Ondansetron, Dexamethasone and Midazolam  Airway Management Planned: LMA  Additional Equipment:   Intra-op Plan:   Post-operative Plan: Extubation in OR  Informed Consent: I have reviewed the patients History and Physical, chart, labs and discussed the procedure including the risks, benefits and alternatives for the proposed anesthesia with the patient or authorized representative who has indicated his/her understanding and acceptance.   Dental Advisory Given  Plan Discussed with: Anesthesiologist,  CRNA and Surgeon  Anesthesia Plan Comments:         Anesthesia Quick Evaluation

## 2017-03-18 NOTE — Interval H&P Note (Signed)
History and Physical Interval Note:  03/18/2017 9:32 AM  Norma Austin  has presented today for surgery, with the diagnosis of LEFT BREAST CANCER  The various methods of treatment have been discussed with the patient and family. After consideration of risks, benefits and other options for treatment, the patient has consented to  Procedure(s): BREAST LUMPECTOMY WITH SENTINEL LYMPH NODE BX (Left) as a surgical intervention .  The patient's history has been reviewed, patient examined, no change in status, stable for surgery.  I have reviewed the patient's chart and labs.  Questions were answered to the patient's satisfaction.     Shaleen Talamantez G

## 2017-03-18 NOTE — H&P (View-Only) (Signed)
Patient ID: Norma Austin, female   DOB: May 28, 1961, 57 y.o.   MRN: [id number]  Chief Complaint  Patient presents with  . Pre-op Exam    HPI Norma Austin is a 56 y.o. female here today for her pre op left lumpectomy with SLN biopsy scheduled for 03/18/2017.  HPI  Past Medical History:  Diagnosis Date  . Arthritis   . Breast cancer ([place]) 2018   INVASIVE MAMMARY CARCINOMA  . GERD (gastroesophageal reflux disease)   . Indigestion   . Plantar fasciitis     Past Surgical History:  Procedure Laterality Date  . BREAST BIOPSY Left 02/24/2017   INVASIVE MAMMARY CARCINOMA  . GANGLION CYST EXCISION  1990  . MOUTH SURGERY     4 BOTTOM FRONT DENTAL IMPLANTS    Family History  Problem Relation Age of Onset  . Breast cancer Mother 89  . Lung cancer Father   . Thyroid cancer Cousin   . Colon cancer Neg Hx     Social History Social History  Substance Use Topics  . Smoking status: Former Smoker    Packs/day: 1.00    Years: 25.00    Types: Cigarettes    Quit date: 03/12/2007  . Smokeless tobacco: Never Used  . Alcohol use 0.0 oz/week     Comment: occasional    Allergies  Allergen Reactions  . Adhesive [Tape]     WHELPS    Current Outpatient Prescriptions  Medication Sig Dispense Refill  . acetaminophen (TYLENOL) 500 MG tablet Take 1,000 mg by mouth every 6 (six) hours as needed (for pain.).    Marland Kitchen diclofenac (VOLTAREN) 50 MG EC tablet Take 50 mg by mouth 2 (two) times daily as needed (for plantar fasciitis/arthritis.).     Marland Kitchen ranitidine (ZANTAC) 150 MG tablet Take 150 mg by mouth as needed for heartburn.     No current facility-administered medications for this visit.     Review of Systems Review of Systems  Constitutional: Negative.   Respiratory: Negative.   Cardiovascular: Negative.     Blood pressure 118/78, pulse 78, resp. rate 12, height 5\' 7"  (1.702 m), weight 246 lb (111.6 kg).  Physical Exam Physical Exam  Constitutional: She is oriented to person, place,  and time. She appears well-developed and well-nourished.  Eyes: Conjunctivae are normal. No scleral icterus.  Pulmonary/Chest:    Lymphadenopathy:    She has no axillary adenopathy.  Neurological: She is alert and oriented to person, place, and time.  Skin: Skin is warm and dry.  Psychiatric: She has a normal mood and affect.    Data Reviewed Pathology Assessment   Invasive mammary carcinoma, ER/PR ps, her 2 not amplified. Clinical T1c,N0  Plan   Pt advised fully on procedure of lumpectomy, sentinel node biopsy. Final staging will be based on the pathology reports. Role of radiation possible chemotherapy and possibly obtaining Mammoprint were briefly explained today.   Patient is scheduled for left lumpectomy with SLN biopsy on 03/18/2017.    HPI, Physical Exam, Assessment and Plan have been scribed under the direction and in the presence of Mckinley Jewel, MD  Concepcion Living, LPN I have completed the exam and reviewed the above documentation for accuracy and completeness.  I agree with the above.  Haematologist has been used and any errors in dictation or transcription are unintentional.  Lequita Meadowcroft G. Jamal Collin, M.D., F.A.C.S.   Junie Panning G 03/11/2017, 3:29 PM

## 2017-03-18 NOTE — Interval H&P Note (Signed)
History and Physical Interval Note:  03/18/2017 9:33 AM  Norma Austin  has presented today for surgery, with the diagnosis of LEFT BREAST CANCER  The various methods of treatment have been discussed with the patient and family. After consideration of risks, benefits and other options for treatment, the patient has consented to  Procedure(s): BREAST LUMPECTOMY WITH SENTINEL LYMPH NODE BX (Left) as a surgical intervention .  The patient's history has been reviewed, patient examined, no change in status, stable for surgery.  I have reviewed the patient's chart and labs.  Questions were answered to the patient's satisfaction.     SANKAR,SEEPLAPUTHUR G

## 2017-03-18 NOTE — Anesthesia Postprocedure Evaluation (Signed)
Anesthesia Post Note  Patient: Norma Austin  Procedure(s) Performed: Procedure(s) (LRB): BREAST LUMPECTOMY WITH SENTINEL LYMPH NODE BX (Left)  Patient location during evaluation: PACU Anesthesia Type: General Level of consciousness: awake and alert Pain management: pain level controlled Vital Signs Assessment: post-procedure vital signs reviewed and stable Respiratory status: spontaneous breathing, nonlabored ventilation, respiratory function stable and patient connected to nasal cannula oxygen Cardiovascular status: blood pressure returned to baseline and stable Postop Assessment: no signs of nausea or vomiting Anesthetic complications: no     Last Vitals:  Vitals:   03/18/17 1326 03/18/17 1358  BP: (!) 141/72 126/76  Pulse: (!) 56 67  Resp: 16 16  Temp: 36.6 C   SpO2: 100% 100%    Last Pain:  Vitals:   03/18/17 1358  TempSrc:   PainSc: 1                  Martha Clan

## 2017-03-18 NOTE — Discharge Instructions (Signed)

## 2017-03-18 NOTE — Anesthesia Post-op Follow-up Note (Signed)
Anesthesia QCDR form completed.        

## 2017-03-18 NOTE — Anesthesia Procedure Notes (Signed)
Procedure Name: LMA Insertion Date/Time: 03/18/2017 10:36 AM Performed by: Jonna Clark Pre-anesthesia Checklist: Patient identified, Patient being monitored, Timeout performed, Emergency Drugs available and Suction available Patient Re-evaluated:Patient Re-evaluated prior to induction Oxygen Delivery Method: Circle system utilized Preoxygenation: Pre-oxygenation with 100% oxygen Induction Type: IV induction Ventilation: Mask ventilation without difficulty LMA: LMA inserted LMA Size: 3.5 Tube type: Oral Number of attempts: 1 Placement Confirmation: positive ETCO2 and breath sounds checked- equal and bilateral Tube secured with: Tape Dental Injury: Teeth and Oropharynx as per pre-operative assessment

## 2017-03-18 NOTE — Op Note (Signed)
Preop diagnosis: Carcinoma left breast upper outer quadrant  Post op diagnosis: Same  Operation: Left breast lumpectomy with ultrasound localization and sentinel node biopsy  Surgeon: Mckinley Jewel  Assistant:     Anesthesia: Gen.  Complications: None  EBL: Less than 10 mL  Drains: None  Description: This patient had a primary approximately a centimeter in size located at about the 1:30 location in the left breast about 7 cm from the nipple. Core biopsy confirmed this was a carcinoma with favorable features. Patient underwent nuclear contrast injection for sentinel node identification preoperatively. Patient was then put to sleep with an LMA and timeout performed. The left breast and axilla were inspected. A Gamma finder showed good activity in the single focus anterior axilla but to ensure adequate sampling 10 mL of 50% diluted methylene blue was injected into the subareolar space also. Left breast  xilla was then prepped and draped as sterile field. A 1H2 1/2 inch transverse incision along the skin crease was made overlying the inferior anterior axilla where the signal activity was noted. Using the [facility] finder as a guide's sentinel nodes numbering 3 were identified all with significant activity. These were separately removed and sent to pathology for evaluation. After ensuring hemostasis the deep tissues were closed in layers with 2-0 Vicryl. Skin closed with subcuticular 3-0 Monocryl. Ultrasound was then utilized located primarily and the clip associated with it. With a small stab incision a Bard ultrawire was placed through the lesion and anchored in place. The curvilinear incision in the upper-outer aspect was then made and then the wire was freed from the skin. The skin and subcutaneous tissue was elevated on the 2 sides. Using the wire as a guide and by finger palpation of a small palpable mass lumpectomy is completed using cautery. Grossly the margins appeared to be clear. The margins  were tagged and sent to mammography showing the presence of the clip and subsequently to pathology. After ensuring hemostasis the wound was irrigated and the deeper tissues were closed with interrupted 2-0 Vicryl. The skin was closed with subcuticular 3-0 Monocryl. Both incisions were covered with Dermabond. Patient tolerated the procedure well with no immediate problems encountered. She was then returned recovery room stable condition.

## 2017-03-22 ENCOUNTER — Encounter: Payer: Self-pay | Admitting: *Deleted

## 2017-03-22 NOTE — Progress Notes (Signed)
  Oncology Nurse Navigator Documentation  Navigator Location: [facility] (03/22/17 1400)   )Navigator Encounter Type: Telephone (03/22/17 1400) Telephone: [place] Call (03/22/17 1400)                     Treatment Phase: Other (post surgery ) (03/22/17 1400)                            Time Spent with Patient: 15 (03/22/17 1400)   Called patient today to follow up post surgery on Friday.  States "I'm just a little sore".  She is doing well.  No needs at this time.

## 2017-03-24 ENCOUNTER — Telehealth: Payer: Self-pay | Admitting: *Deleted

## 2017-03-24 NOTE — Telephone Encounter (Signed)
mammoprint orders faxed

## 2017-03-24 NOTE — Telephone Encounter (Signed)
-----   Message from Dominga Ferry, [place] sent at 03/22/2017 12:43 PM EDT ----- Please order mammoprint on Wednesday. Thanks. ----- Message ----- From: [name], MD Sent: 03/22/2017  11:44 AM To: Dominga Ferry, CMA  Please order  Mammoprint -Dr.Baker is aware of it

## 2017-03-25 ENCOUNTER — Ambulatory Visit (INDEPENDENT_AMBULATORY_CARE_PROVIDER_SITE_OTHER): Payer: BLUE CROSS/BLUE SHIELD | Admitting: General Surgery

## 2017-03-25 ENCOUNTER — Encounter: Payer: Self-pay | Admitting: General Surgery

## 2017-03-25 VITALS — BP 142/82 | HR 80 | Resp 16 | Ht 67.0 in | Wt 244.0 lb

## 2017-03-25 DIAGNOSIS — Z170 Estrogen receptor positive status [ER+]: Secondary | ICD-10-CM

## 2017-03-25 DIAGNOSIS — C50412 Malignant neoplasm of upper-outer quadrant of left female breast: Secondary | ICD-10-CM

## 2017-03-25 NOTE — Progress Notes (Signed)
Patient ID: Norma Austin, female   DOB: Mar 17, 1961, 56 y.o.   MRN: [id number]  Chief Complaint  Patient presents with  . Routine Post Op    HPI Norma Austin is a 56 y.o. female. here today for her post op left lumpectomy with SLN biopsy done 03/18/2017. Mild discomfort at axillary site.   HPI  Past Medical History:  Diagnosis Date  . Arthritis   . Breast cancer ([place]) 2018   INVASIVE MAMMARY CARCINOMA  . GERD (gastroesophageal reflux disease)   . Indigestion   . Plantar fasciitis     Past Surgical History:  Procedure Laterality Date  . BREAST BIOPSY Left 02/24/2017   INVASIVE MAMMARY CARCINOMA  . BREAST LUMPECTOMY WITH SENTINEL LYMPH NODE BIOPSY Left 03/18/2017   Procedure: BREAST LUMPECTOMY WITH SENTINEL LYMPH NODE BX;  Surgeon: [name], MD;  Location: [facility] ORS;  Service: General;  Laterality: Left;  . GANGLION CYST EXCISION  1990  . MOUTH SURGERY     4 BOTTOM FRONT DENTAL IMPLANTS    Family History  Problem Relation Age of Onset  . Breast cancer Mother 32  . Lung cancer Father   . Thyroid cancer Cousin   . Colon cancer Neg Hx     Social History Social History  Substance Use Topics  . Smoking status: Former Smoker    Packs/day: 1.00    Years: 25.00    Types: Cigarettes    Quit date: 03/12/2007  . Smokeless tobacco: Never Used  . Alcohol use 0.0 oz/week     Comment: occasional    Allergies  Allergen Reactions  . Adhesive [Tape]     WHELPS    Current Outpatient Prescriptions  Medication Sig Dispense Refill  . acetaminophen (TYLENOL) 500 MG tablet Take 1,000 mg by mouth every 6 (six) hours as needed (for pain.).    Marland Kitchen diclofenac (VOLTAREN) 50 MG EC tablet Take 50 mg by mouth 2 (two) times daily as needed (for plantar fasciitis/arthritis.).     Marland Kitchen ranitidine (ZANTAC) 150 MG tablet Take 150 mg by mouth as needed for heartburn.    . traMADol (ULTRAM) 50 MG tablet Take 1 tablet (50 mg total) by mouth every 6 (six) hours as needed. 20 tablet 0   No  current facility-administered medications for this visit.     Review of Systems Review of Systems  Constitutional: Negative.   Respiratory: Negative.   Cardiovascular: Negative.     Blood pressure (!) 142/82, pulse 80, resp. rate 16, height '5\' 7"'$  (1.702 m), weight 244 lb (110.7 kg), last menstrual period 03/19/2011.  Physical Exam Physical Exam  Constitutional: She is oriented to person, place, and time. She appears well-developed and well-nourished.  HENT:  Mouth/Throat: Oropharynx is clear and moist.  Pulmonary/Chest:    Neurological: She is alert and oriented to person, place, and time.  Skin: Skin is warm and dry.  Psychiatric: She has a normal mood and affect.    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 T1c, N0, 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  Request that pathology perform Mammoprint testing  [place] of left breast shows 1.68 cm distance between pocket and skin surface -suitable for MammoSite consideration    Plan      Mammoprint  testing ordered. If low risk we'll discuss the option for a MammoSite The patient is aware to call back  for any questions or concerns.       HPI, Physical Exam, Assessment and Plan have been scribed under the direction and in the presence of Mckinley Jewel, MD Karie Fetch, RN  I have completed the exam and reviewed the above documentation for accuracy and completeness.  I agree with the above.  Haematologist has been used and any errors in dictation or transcription are unintentional.  Norma Austin. Norma Austin, M.D., F.A.C.S.  Norma Austin 03/29/2017, 8:19 AM

## 2017-03-25 NOTE — Patient Instructions (Signed)
Plan for Mammoprint  testing in a couple of weeks  The patient is aware to call back for any questions or concerns.

## 2017-03-30 ENCOUNTER — Other Ambulatory Visit: Payer: Self-pay

## 2017-03-30 ENCOUNTER — Telehealth: Payer: Self-pay | Admitting: General Surgery

## 2017-03-30 ENCOUNTER — Encounter: Payer: Self-pay | Admitting: General Surgery

## 2017-03-30 DIAGNOSIS — Z170 Estrogen receptor positive status [ER+]: Principal | ICD-10-CM

## 2017-03-30 DIAGNOSIS — C50412 Malignant neoplasm of upper-outer quadrant of left female breast: Secondary | ICD-10-CM

## 2017-03-30 NOTE — Telephone Encounter (Signed)
I SPOKE WITH PATIENT AND LET HER KNOW SHE HAS AN APPOINTMENT WITH DR CRYSTAL ON 04-01-17 @ 9:00AM.

## 2017-03-30 NOTE — Telephone Encounter (Signed)
Mammogram results showed low risk. Patient was advised on this by telephone today. She will need a consultation from radiation oncologist. Ms. Iwasaki was advised that she would be a candidate for MammoSite.

## 2017-04-01 ENCOUNTER — Encounter: Payer: Self-pay | Admitting: Radiation Oncology

## 2017-04-01 ENCOUNTER — Ambulatory Visit
Admission: RE | Admit: 2017-04-01 | Discharge: 2017-04-01 | Disposition: A | Discharge status: Home / Self Care | Payer: BLUE CROSS/BLUE SHIELD | Source: Ambulatory Visit | Attending: Radiation Oncology | Admitting: Radiation Oncology

## 2017-04-01 ENCOUNTER — Telehealth: Payer: Self-pay | Admitting: *Deleted

## 2017-04-01 VITALS — BP 134/70 | HR 72 | Temp 97.6°F | Resp 20 | Ht 67.0 in | Wt 247.8 lb

## 2017-04-01 DIAGNOSIS — K30 Functional dyspepsia: Secondary | ICD-10-CM | POA: Diagnosis not present

## 2017-04-01 DIAGNOSIS — M722 Plantar fascial fibromatosis: Secondary | ICD-10-CM | POA: Insufficient documentation

## 2017-04-01 DIAGNOSIS — Z79899 Other long term (current) drug therapy: Secondary | ICD-10-CM | POA: Diagnosis not present

## 2017-04-01 DIAGNOSIS — Z801 Family history of malignant neoplasm of trachea, bronchus and lung: Secondary | ICD-10-CM | POA: Insufficient documentation

## 2017-04-01 DIAGNOSIS — Z808 Family history of malignant neoplasm of other organs or systems: Secondary | ICD-10-CM | POA: Insufficient documentation

## 2017-04-01 DIAGNOSIS — C50412 Malignant neoplasm of upper-outer quadrant of left female breast: Secondary | ICD-10-CM | POA: Insufficient documentation

## 2017-04-01 DIAGNOSIS — K219 Gastro-esophageal reflux disease without esophagitis: Secondary | ICD-10-CM | POA: Diagnosis not present

## 2017-04-01 DIAGNOSIS — M129 Arthropathy, unspecified: Secondary | ICD-10-CM | POA: Diagnosis not present

## 2017-04-01 DIAGNOSIS — Z803 Family history of malignant neoplasm of breast: Secondary | ICD-10-CM | POA: Insufficient documentation

## 2017-04-01 DIAGNOSIS — Z510 Encounter for antineoplastic radiation therapy: Secondary | ICD-10-CM | POA: Diagnosis not present

## 2017-04-01 DIAGNOSIS — Z87891 Personal history of nicotine dependence: Secondary | ICD-10-CM | POA: Insufficient documentation

## 2017-04-01 DIAGNOSIS — Z170 Estrogen receptor positive status [ER+]: Secondary | ICD-10-CM | POA: Diagnosis not present

## 2017-04-01 MED ORDER — CEFADROXIL 500 MG PO CAPS: 500.0000 mg | ORAL_CAPSULE | Freq: Two times a day (BID) | ORAL | 0 refills | Status: DC

## 2017-04-01 NOTE — Consult Note (Signed)
NEW PATIENT EVALUATION  Name: Norma Austin  MRN: [id number]  Date:   04/01/2017     DOB: 14-Dec-1960   This 56 y.o. female patient presents to the clinic for initial evaluation of stage I (T1 CN 0 M0) ER/PR positive HER-2/neu negative invasive mammary carcinoma status post wide local excision.  REFERRING PHYSICIAN: Sifferman, Lucky Rathke, PA*  CHIEF COMPLAINT:  Chief Complaint  Patient presents with  . Breast Cancer    Left    DIAGNOSIS: The encounter diagnosis was Malignant neoplasm of upper-outer quadrant of left breast in female, estrogen receptor positive ([place]).   PREVIOUS INVESTIGATIONS:  Mammogram ultrasound reviewed Pathology reports reviewed Clinical notes reviewed  HPI: Patient is a pleasant 56 year old female who presented with an abnormal mammogram of her left breast showing a spiculated mass in the upper outer quadrant confirmed on ultrasound. There was some palpable thickening at the 1:30 position 7 cm from the nipple. Ultrasound-guided biopsy was positive for invasive mammary carcinoma. She went on to have a wide local excision of the left breast showing a 1.5 cm overall grade 1 invasive mammary carcinoma with 3 sentinel lymph nodes negative for metastatic disease. Tumor was strongly ER/PR positive HER-2/neu not overexpressed. MammaPrint was performed showing low risk of recurrence no systemic chemotherapy will be given. She is seen today for radiation oncology opinion. She is doing well. She specifically denies breast tenderness cough or bone pain. Dr. Jamal Collin has evaluated the lumpectomy site showing appropriate for MammoSite balloon placement.  PLANNED TREATMENT REGIMEN: Left breast accelerated partial breast radiation  PAST MEDICAL HISTORY:  has a past medical history of Arthritis; Breast cancer ([place]) (2018); GERD (gastroesophageal reflux disease); Indigestion; and Plantar fasciitis.    PAST SURGICAL HISTORY:  Past Surgical History:  Procedure Laterality Date  .  BREAST BIOPSY Left 02/24/2017   INVASIVE MAMMARY CARCINOMA  . BREAST LUMPECTOMY WITH SENTINEL LYMPH NODE BIOPSY Left 03/18/2017   Procedure: BREAST LUMPECTOMY WITH SENTINEL LYMPH NODE BX;  Surgeon: [name], MD;  Location: [facility] ORS;  Service: General;  Laterality: Left;  . GANGLION CYST EXCISION  1990  . MOUTH SURGERY     4 BOTTOM FRONT DENTAL IMPLANTS    FAMILY HISTORY: family history includes Breast cancer (age of onset: 57) in her mother; Lung cancer in her father; Thyroid cancer in her cousin.  SOCIAL HISTORY:  reports that she quit smoking about 10 years ago. Her smoking use included Cigarettes. She has a 25.00 pack-year smoking history. She has never used smokeless tobacco. She reports that she drinks alcohol. She reports that she does not use drugs.  ALLERGIES: Adhesive [tape]  MEDICATIONS:  Current Outpatient Prescriptions  Medication Sig Dispense Refill  . acetaminophen (TYLENOL) 500 MG tablet Take 1,000 mg by mouth every 6 (six) hours as needed (for pain.).    Marland Kitchen diclofenac (VOLTAREN) 50 MG EC tablet Take 50 mg by mouth 2 (two) times daily as needed (for plantar fasciitis/arthritis.).     Marland Kitchen ranitidine (ZANTAC) 150 MG tablet Take 150 mg by mouth as needed for heartburn.    . traMADol (ULTRAM) 50 MG tablet Take 1 tablet (50 mg total) by mouth every 6 (six) hours as needed. 20 tablet 0  . cefadroxil (DURICEF) 500 MG capsule Take 1 capsule (500 mg total) by mouth 2 (two) times daily. Start antibiotic one hour before office procedure on 04-13-17 20 capsule 0   No current facility-administered medications for this encounter.     ECOG PERFORMANCE STATUS:  0 - Asymptomatic  REVIEW OF SYSTEMS:  Patient denies any weight loss, fatigue, weakness, fever, chills or night sweats. Patient denies any loss of vision, blurred vision. Patient denies any ringing  of the ears or hearing loss. No irregular heartbeat. Patient denies heart murmur or history of fainting. Patient denies any  chest pain or pain radiating to her upper extremities. Patient denies any shortness of breath, difficulty breathing at night, cough or hemoptysis. Patient denies any swelling in the lower legs. Patient denies any nausea vomiting, vomiting of blood, or coffee ground material in the vomitus. Patient denies any stomach pain. Patient states has had normal bowel movements no significant constipation or diarrhea. Patient denies any dysuria, hematuria or significant nocturia. Patient denies any problems walking, swelling in the joints or loss of balance. Patient denies any skin changes, loss of hair or loss of weight. Patient denies any excessive worrying or anxiety or significant depression. Patient denies any problems with insomnia. Patient denies excessive thirst, polyuria, polydipsia. Patient denies any swollen glands, patient denies easy bruising or easy bleeding. Patient denies any recent infections, allergies or URI. Patient "s visual fields have not changed significantly in recent time.    PHYSICAL EXAM: BP 134/70 (BP Location: Left Arm, Patient Position: Sitting, Cuff Size: Large)   Pulse 72   Temp 97.6 F (36.4 C) (Tympanic)   Resp 20   Ht '5\' 7"'$  (1.702 m)   Wt 247 lb 12.8 oz (112.4 kg)   LMP 03/19/2011 (Approximate) Comment: LMP 2011 or 2012  BMI 38.81 kg/m  Well-developed female in NAD she status post wide local excision and sentinel node biopsy of the left breast. Incisions are healing well. No dominant mass or nodularity is noted in either breast in 2 positions examined. No axillary or supraclavicular adenopathy is appreciated. Well-developed well-nourished patient in NAD. HEENT reveals PERLA, EOMI, discs not visualized.  Oral cavity is clear. No oral mucosal lesions are identified. Neck is clear without evidence of cervical or supraclavicular adenopathy. Lungs are clear to A&P. Cardiac examination is essentially unremarkable with regular rate and rhythm without murmur rub or thrill. Abdomen is  benign with no organomegaly or masses noted. Motor sensory and DTR levels are equal and symmetric in the upper and lower extremities. Cranial nerves II through XII are grossly intact. Proprioception is intact. No peripheral adenopathy or edema is identified. No motor or sensory levels are noted. Crude visual fields are within normal range.  LABORATORY DATA: Pathology reports reviewed    RADIOLOGY RESULTS: Mammograms and ultrasound reviewed and compatible with the above-stated findings   IMPRESSION: Stage I ER/PR positive HER-2/neu negative invasive mammary carcinoma of the left breast status post wide local excision and sentinel node biopsy in 56 year old female  PLAN: At this time I believe the patient would be a good candidate for accelerated partial breast irradiation. We'll refer her back to surgeon for placement of MammoSite balloon and then perform treatment planning determination viability of the balloon implant. I have gone over risks and benefits of both whole breast radiation as well as accelerated partial breast radiation. Side effects including posterior step area of inflammation over the balloon site fatigue permanent scarring in her breast all were discussed in detail with the patient. She desires MammoSite balloon placement. Patient also would be a candidate for antiestrogen therapy after completion of radiation. Should we not be able place the balloon successfully I would recommend whole breast radiation and that also was discussed with the patient.  I would like to take this opportunity to  thank you for allowing me to participate in the care of your patient.Armstead Peaks., MD

## 2017-04-01 NOTE — Progress Notes (Signed)
[facility].

## 2017-04-01 NOTE — Telephone Encounter (Signed)
Mammosite schedule reviewed with the patient Placement  04-13-17 at [facility] at 1100 Scan 04-15-17 Treat 04-16-17 thru 04-22-17 Aware the [place] will be calling her for more details Aware of ATB and directions reviewed. Aware no showers and to wear her bra while mammosite in place. Pt agrees.

## 2017-04-05 LAB — SURGICAL PATHOLOGY

## 2017-04-13 ENCOUNTER — Ambulatory Visit (INDEPENDENT_AMBULATORY_CARE_PROVIDER_SITE_OTHER): Payer: BLUE CROSS/BLUE SHIELD | Admitting: General Surgery

## 2017-04-13 ENCOUNTER — Inpatient Hospital Stay: Payer: Self-pay

## 2017-04-13 ENCOUNTER — Encounter: Payer: Self-pay | Admitting: General Surgery

## 2017-04-13 VITALS — BP 134/76 | HR 78 | Resp 14 | Ht 67.0 in | Wt 247.0 lb

## 2017-04-13 DIAGNOSIS — C50912 Malignant neoplasm of unspecified site of left female breast: Secondary | ICD-10-CM

## 2017-04-13 MED ORDER — PROPOFOL 10 MG/ML IV BOLUS
INTRAVENOUS | Status: DC | PRN
  Administered 2017-03-18: 150 mg via INTRAVENOUS

## 2017-04-13 NOTE — Progress Notes (Signed)
Patient ID: Norma Austin, female   DOB: 1961-07-10, 56 y.o.   MRN: [id number]  Chief Complaint  Patient presents with  . Procedure    Left Mammosite    HPI Norma Austin is a 56 y.o. female is her today for a left breast mammosite placement. Patient did take antibiotic this morning.  HPI  Past Medical History:  Diagnosis Date  . Arthritis   . Breast cancer ([place]) 2018   INVASIVE MAMMARY CARCINOMA  . GERD (gastroesophageal reflux disease)   . Indigestion   . Plantar fasciitis     Past Surgical History:  Procedure Laterality Date  . BREAST BIOPSY Left 02/24/2017   INVASIVE MAMMARY CARCINOMA  . BREAST LUMPECTOMY WITH SENTINEL LYMPH NODE BIOPSY Left 03/18/2017   Procedure: BREAST LUMPECTOMY WITH SENTINEL LYMPH NODE BX;  Surgeon: [name], MD;  Location: [facility] ORS;  Service: General;  Laterality: Left;  . GANGLION CYST EXCISION  1990  . MOUTH SURGERY     4 BOTTOM FRONT DENTAL IMPLANTS    Family History  Problem Relation Age of Onset  . Breast cancer Mother 89  . Lung cancer Father   . Thyroid cancer Cousin   . Colon cancer Neg Hx     Social History Social History  Substance Use Topics  . Smoking status: Former Smoker    Packs/day: 1.00    Years: 25.00    Types: Cigarettes    Quit date: 03/12/2007  . Smokeless tobacco: Never Used  . Alcohol use 0.0 oz/week     Comment: occasional    Allergies  Allergen Reactions  . Adhesive [Tape]     WHELPS    Current Outpatient Prescriptions  Medication Sig Dispense Refill  . acetaminophen (TYLENOL) 500 MG tablet Take 1,000 mg by mouth every 6 (six) hours as needed (for pain.).    Marland Kitchen cefadroxil (DURICEF) 500 MG capsule Take 1 capsule (500 mg total) by mouth 2 (two) times daily. Start antibiotic one hour before office procedure on 04-13-17 20 capsule 0  . ranitidine (ZANTAC) 150 MG tablet Take 150 mg by mouth as needed for heartburn.    . traMADol (ULTRAM) 50 MG tablet Take 1 tablet (50 mg total) by mouth every 6  (six) hours as needed. 20 tablet 0  . diclofenac (VOLTAREN) 50 MG EC tablet Take 50 mg by mouth 2 (two) times daily as needed (for plantar fasciitis/arthritis.).      No current facility-administered medications for this visit.     Review of Systems Review of Systems  Constitutional: Negative.   Respiratory: Negative.   Cardiovascular: Negative.     Blood pressure 134/76, pulse 78, resp. rate 14, height 5\' 7"  (1.702 m), weight 247 lb (112 kg), last menstrual period 03/19/2011.  Physical Exam Physical Exam  Constitutional: She appears well-developed and well-nourished.    Data Reviewed Prior note  Assessment    CA left breast uoq, er/pr pos, her 2 neg. T1c, No, M0. Mammoprint with low risk    Plan    EXCISIONAL BREAST BIOPSY REPORT  Name:  Norma Austin DOB:  1961/05/29  Vital signs:BP 134/76   Pulse 78   Resp 14   Ht 5\' 7"  (1.702 m)   Wt 247 lb (112 kg)   LMP 03/19/2011 (Approximate) Comment: LMP 2011 or 2012  BMI 38.69 kg/m   The patient has been found to be an acceptable candidate for MammoSite radiation treatment for her recently diagnosed breast cancer of the   Left breast  after consultation with Norma Austin, Norma AustinD. from radiation oncology.  The patient returns today for planned insertion of a treatment balloon.  Pre-procedure ultrasound has shown an adequate buffer between the skin and the underlying cavity from her wide excision.  A total of 10cc of 1% Xylocaine with 0.5% Marcaine was used for local anesthesia and was well tolerated.  The breast was then prepped with Chloraprep x3 and draped. Under ultrasound guidance the 4-5 centimeter   MammoSite balloon was inserted through a separate 1 cm incision latera/inferior to lumpectomy cavity incision. CED was used first to ensure adequate skin distance. Mammosite was inflated to a volume of 38cc with normal saline mixed with Isovue-M 200 Closest skin to balloon margin is 1.2 cm The procedure was well tolerated.   Scant bleeding was noted.  Antibiotic cream was placed at the insertion site and a gauze pad applied.  She has an appointment with radiation oncology staff for a CT scan of the breast to confirm balloon size and skin margins in the near future.  CC:  Sifferman, Norma Rathke, PA-C CC: Norma Austin, Norma AustinD.  Norma Austin  HPI, Physical Exam, Assessment and Plan have been scribed under the direction and in the presence of Norma Jewel, MD.  Norma Austin, CMA I have completed the exam and reviewed the above documentation for accuracy and completeness.  I agree with the above.  Haematologist has been used and any errors in dictation or transcription are unintentional.  Norma Austin. Norma Austin, Norma AustinD., F.A.C.S.          Norma Austin 04/13/2017, 1:41 PM

## 2017-04-13 NOTE — Addendum Note (Signed)
Addendum  created 04/13/17 1257 by Jonna Clark, CRNA   Anesthesia Intra Meds edited

## 2017-04-15 ENCOUNTER — Ambulatory Visit
Admission: RE | Admit: 2017-04-15 | Discharge: 2017-04-15 | Disposition: A | Discharge status: Home / Self Care | Payer: BLUE CROSS/BLUE SHIELD | Source: Ambulatory Visit | Attending: Radiation Oncology | Admitting: Radiation Oncology

## 2017-04-15 DIAGNOSIS — C50412 Malignant neoplasm of upper-outer quadrant of left female breast: Secondary | ICD-10-CM | POA: Diagnosis not present

## 2017-04-16 ENCOUNTER — Ambulatory Visit
Admission: RE | Admit: 2017-04-16 | Discharge: 2017-04-16 | Disposition: A | Discharge status: Home / Self Care | Payer: BLUE CROSS/BLUE SHIELD | Source: Ambulatory Visit | Attending: Radiation Oncology | Admitting: Radiation Oncology

## 2017-04-16 DIAGNOSIS — C50412 Malignant neoplasm of upper-outer quadrant of left female breast: Secondary | ICD-10-CM | POA: Diagnosis not present

## 2017-04-19 ENCOUNTER — Ambulatory Visit
Admission: RE | Admit: 2017-04-19 | Discharge: 2017-04-19 | Disposition: A | Discharge status: Home / Self Care | Payer: BLUE CROSS/BLUE SHIELD | Source: Ambulatory Visit | Attending: Radiation Oncology | Admitting: Radiation Oncology

## 2017-04-19 DIAGNOSIS — C50412 Malignant neoplasm of upper-outer quadrant of left female breast: Secondary | ICD-10-CM | POA: Diagnosis not present

## 2017-04-20 ENCOUNTER — Ambulatory Visit
Admission: RE | Admit: 2017-04-20 | Discharge: 2017-04-20 | Disposition: A | Discharge status: Home / Self Care | Payer: BLUE CROSS/BLUE SHIELD | Source: Ambulatory Visit | Attending: Radiation Oncology | Admitting: Radiation Oncology

## 2017-04-20 DIAGNOSIS — C50412 Malignant neoplasm of upper-outer quadrant of left female breast: Secondary | ICD-10-CM | POA: Diagnosis not present

## 2017-04-21 ENCOUNTER — Ambulatory Visit
Admission: RE | Admit: 2017-04-21 | Discharge: 2017-04-21 | Disposition: A | Discharge status: Home / Self Care | Payer: BLUE CROSS/BLUE SHIELD | Source: Ambulatory Visit | Attending: Radiation Oncology | Admitting: Radiation Oncology

## 2017-04-21 DIAGNOSIS — C50412 Malignant neoplasm of upper-outer quadrant of left female breast: Secondary | ICD-10-CM | POA: Diagnosis not present

## 2017-04-22 ENCOUNTER — Ambulatory Visit
Admission: RE | Admit: 2017-04-22 | Discharge: 2017-04-22 | Disposition: A | Discharge status: Home / Self Care | Payer: BLUE CROSS/BLUE SHIELD | Source: Ambulatory Visit | Attending: Radiation Oncology | Admitting: Radiation Oncology

## 2017-04-22 DIAGNOSIS — C50412 Malignant neoplasm of upper-outer quadrant of left female breast: Secondary | ICD-10-CM | POA: Diagnosis not present

## 2017-04-27 ENCOUNTER — Encounter: Payer: Self-pay | Admitting: General Surgery

## 2017-04-27 ENCOUNTER — Ambulatory Visit (INDEPENDENT_AMBULATORY_CARE_PROVIDER_SITE_OTHER): Payer: BLUE CROSS/BLUE SHIELD | Admitting: General Surgery

## 2017-04-27 VITALS — BP 142/78 | HR 84 | Resp 14 | Ht 67.0 in | Wt 248.0 lb

## 2017-04-27 DIAGNOSIS — Z170 Estrogen receptor positive status [ER+]: Secondary | ICD-10-CM

## 2017-04-27 DIAGNOSIS — C50412 Malignant neoplasm of upper-outer quadrant of left female breast: Secondary | ICD-10-CM

## 2017-04-27 NOTE — Patient Instructions (Addendum)
The patient is aware to call back for any questions or concerns.  The patient has been asked to return to the office in three months with a unilateral left breast diagnostic mammogram.  Referral to Dr. Tasia Catchings.

## 2017-04-27 NOTE — Progress Notes (Signed)
Patient ID: Norma Austin, female   DOB: 02-08-1961, 56 y.o.   MRN: [id number]  Chief Complaint  Patient presents with  . Follow-up    HPI Norma Austin is a 56 y.o. female.  here for follow up left breast mammosite done on 04/13/2017. No new complaints.  Post left lumpectomy with SLN biopsy done 03/18/2017.   HPI  Past Medical History:  Diagnosis Date  . Arthritis   . Breast cancer ([place]) 2018   INVASIVE MAMMARY CARCINOMA, T1c, N0, 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  . GERD (gastroesophageal reflux disease)   . Indigestion   . Plantar fasciitis     Past Surgical History:  Procedure Laterality Date  . BREAST BIOPSY Left 02/24/2017   INVASIVE MAMMARY CARCINOMA  . BREAST LUMPECTOMY WITH SENTINEL LYMPH NODE BIOPSY Left 03/18/2017   Procedure: BREAST LUMPECTOMY WITH SENTINEL LYMPH NODE BX;  Surgeon: [name], MD;  Location: [facility] ORS;  Service: General;  Laterality: Left;  . GANGLION CYST EXCISION  1990  . MOUTH SURGERY     4 BOTTOM FRONT DENTAL IMPLANTS    Family History  Problem Relation Age of Onset  . Breast cancer Mother 44  . Lung cancer Father   . Thyroid cancer Cousin   . Colon cancer Neg Hx     Social History Social History  Substance Use Topics  . Smoking status: Former Smoker    Packs/day: 1.00    Years: 25.00    Types: Cigarettes    Quit date: 03/12/2007  . Smokeless tobacco: Never Used  . Alcohol use 0.0 oz/week     Comment: occasional    Allergies  Allergen Reactions  . Adhesive [Tape]     WHELPS    Current Outpatient Prescriptions  Medication Sig Dispense Refill  . acetaminophen (TYLENOL) 500 MG tablet Take 1,000 mg by mouth every 6 (six) hours as needed (for pain.).    Marland Kitchen diclofenac (VOLTAREN) 50 MG EC tablet Take 50 mg by mouth 2 (two) times daily as needed (for plantar fasciitis/arthritis.).     Marland Kitchen ranitidine (ZANTAC) 150 MG tablet Take 150 mg by mouth as needed for heartburn.     No current facility-administered medications for  this visit.     Review of Systems Review of Systems  Blood pressure (!) 142/78, pulse 84, resp. rate 14, height '5\' 7"'$  (1.702 m), weight 248 lb (112.5 kg), last menstrual period 03/19/2011.  Physical Exam Physical Exam  Constitutional: She is oriented to person, place, and time. She appears well-developed and well-nourished.  Pulmonary/Chest:    Neurological: She is alert and oriented to person, place, and time.  Skin: Skin is warm and dry.  Psychiatric: Her behavior is normal.    Data Reviewed Prior notes reviewed.   Assessment    Left breast invasive mammary carcinoma - T1c, N0, 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. Mammoprint with low risk.  Post mammosite on 04/13/17, well healed site.     Plan    The patient has been asked to return to the office in three months with a unilateral left breast diagnostic mammogram. Referral to Dr. Tasia Catchings for oncology follow up     HPI, Physical Exam, Assessment and Plan have been scribed under the direction and in the presence of Norma Jewel, MD Norma Fetch, RN  I have completed the exam and reviewed the above documentation for accuracy and completeness.  I agree with the above.  Haematologist has been used and any  errors in dictation or transcription are unintentional.  Norma Austin Collin, M.D., F.A.C.S.  Norma Austin 04/27/2017, 4:21 PM

## 2017-04-30 ENCOUNTER — Encounter: Payer: Self-pay | Admitting: Oncology

## 2017-04-30 ENCOUNTER — Inpatient Hospital Stay: Payer: BLUE CROSS/BLUE SHIELD | Attending: Oncology | Admitting: Oncology

## 2017-04-30 VITALS — BP 136/88 | HR 73 | Resp 97 | Wt 248.2 lb

## 2017-04-30 DIAGNOSIS — Z170 Estrogen receptor positive status [ER+]: Secondary | ICD-10-CM

## 2017-04-30 DIAGNOSIS — M129 Arthropathy, unspecified: Secondary | ICD-10-CM | POA: Insufficient documentation

## 2017-04-30 DIAGNOSIS — Z79899 Other long term (current) drug therapy: Secondary | ICD-10-CM | POA: Diagnosis not present

## 2017-04-30 DIAGNOSIS — Z808 Family history of malignant neoplasm of other organs or systems: Secondary | ICD-10-CM

## 2017-04-30 DIAGNOSIS — K30 Functional dyspepsia: Secondary | ICD-10-CM

## 2017-04-30 DIAGNOSIS — Z923 Personal history of irradiation: Secondary | ICD-10-CM | POA: Diagnosis not present

## 2017-04-30 DIAGNOSIS — K219 Gastro-esophageal reflux disease without esophagitis: Secondary | ICD-10-CM

## 2017-04-30 DIAGNOSIS — C50412 Malignant neoplasm of upper-outer quadrant of left female breast: Secondary | ICD-10-CM | POA: Diagnosis not present

## 2017-04-30 DIAGNOSIS — Z801 Family history of malignant neoplasm of trachea, bronchus and lung: Secondary | ICD-10-CM | POA: Insufficient documentation

## 2017-04-30 DIAGNOSIS — Z79811 Long term (current) use of aromatase inhibitors: Secondary | ICD-10-CM | POA: Insufficient documentation

## 2017-04-30 DIAGNOSIS — Z803 Family history of malignant neoplasm of breast: Secondary | ICD-10-CM | POA: Diagnosis not present

## 2017-04-30 DIAGNOSIS — Z87891 Personal history of nicotine dependence: Secondary | ICD-10-CM | POA: Insufficient documentation

## 2017-04-30 MED ORDER — LETROZOLE 2.5 MG PO TABS: 2.5000 mg | ORAL_TABLET | Freq: Every day | ORAL | 6 refills | Status: DC

## 2017-04-30 NOTE — Progress Notes (Signed)
Patient here today for follow up regarding breast cancer, denies any concerns.

## 2017-04-30 NOTE — Progress Notes (Signed)
[facility] Consultation Note  Patient Care Team: Sifferman, Grafton Folk as PCP - General (Physician Assistant) [name], MD as Referring Physician (Family Medicine) [name], MD (General Surgery)  CHIEF COMPLAINTS/PURPOSE OF CONSULTATION: I have breast cancer  HISTORY OF PRESENTING ILLNESS: 1 Norma Austin 56 y.o. female with PMH listed as below is referred by Dr.Cook to cancer center for evaluation and management of newly diagnosed breast caner.  Patient had routine mammogram screening which showed left breast mass. This was followed by a diagnostic Mammogram and ultrasound which showed an hypoechoic mass measuring 12 x 7 x 10 mm at 1:30, 7 cm from the nipple, corresponding to thespiculated mass seen mammographically. There is an adjacent near anechoic mass at 1 o'clock, 6 cm from the nipple measuring 6 x 4 x 4 mm. 1:30 mass was biopsy and showed invasive carcinoma of breast, unspecified type, no LVI, No DCIS. The 1 o'clock mass disappeared after biopsy of the 1:30 mass. It maybe a cyst which was poked by the needle for anesthesia.   2 Pathology: 02/24/2017 US guided breast mass biopsy showed invasive mammary carcinoma, grade 1, ER 90% PR90%, HER2 Equivocal by IHC, negative by FISH. S/p left breast lumpectomy and sentinel lymph node biopsy on 03/18/2017. Pathology showed 54m invasive mammary carcinoma of no specific type, margins are all negative, and all 3 sentinel lymph nodes were negative. ER/PR 90%, HERw FISH negative. Mammaprint showed low risk.  3 She had completed MammoSite radiation. Tolerates well.   Interval History:  Patient presents to clinic for follow up visit. She has completed surgery and Mammosite radiation. She tolerates treatment well and today she has no complaint.   Review of Systems  Constitutional: Negative.   HENT:  Negative.   Eyes: Negative.   Respiratory: Negative.   Cardiovascular: Negative.   Gastrointestinal:  Negative.   Endocrine: Negative.   Genitourinary: Negative.    Musculoskeletal: Positive for arthralgias.  Skin: Negative.   Neurological: Negative.   Hematological: Negative.   Psychiatric/Behavioral: Negative.     MEDICAL HISTORY: Past Medical History:  Diagnosis Date  . Arthritis   . [place]   INVASIVE MAMMARY CARCINOMA, T1c, N0, 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  . GERD (gastroesophageal reflux disease)   . Indigestion   . Plantar fasciitis     SURGICAL HISTORY: Past Surgical History:  Procedure Laterality Date  . BREAST BIOPSY Left 02/24/2017   INVASIVE MAMMARY CARCINOMA  . BREAST LUMPECTOMY WITH SENTINEL LYMPH NODE BIOPSY Left 03/18/2017   Procedure: BREAST LUMPECTOMY WITH SENTINEL LYMPH NODE BX;  Surgeon: SChristene Lye MD;  Location: [facility] ORS;  Service: General;  Laterality: Left;  . GANGLION CYST EXCISION  1990  . MOUTH SURGERY     4 BOTTOM FRONT DENTAL IMPLANTS    SOCIAL HISTORY: Social History   Social History  . Marital status: Married    Spouse name: N/A  . Number of children: N/A  . Years of education: N/A   Occupational History  . Not on file.   Social History Main Topics  . Smoking status: Former Smoker    Packs/day: 1.00    Years: 25.00    Types: Cigarettes    Quit date: 03/12/2007  . Smokeless tobacco: Never Used  . Alcohol use 0.0 oz/week     Comment: occasional  . Drug use: No  . Sexual activity: Not on file   Other Topics Concern  . Not on file  Social History Narrative  . No narrative on file    FAMILY HISTORY Family History  Problem Relation Age of Onset  . Breast cancer Mother 86  . Lung cancer Father   . Thyroid cancer Cousin   . Colon cancer Neg Hx     ALLERGIES:  is allergic to adhesive [tape].  MEDICATIONS:  Current Outpatient Prescriptions  Medication Sig Dispense Refill  . acetaminophen (TYLENOL) 500 MG tablet Take 1,000 mg by mouth every 6 (six) hours as needed (for pain.).    Marland Kitchen  diclofenac (VOLTAREN) 50 MG EC tablet Take 50 mg by mouth 2 (two) times daily as needed (for plantar fasciitis/arthritis.).     Marland Kitchen loratadine (CLARITIN) 10 MG tablet Take 10 mg by mouth daily.    . ranitidine (ZANTAC) 150 MG tablet Take 150 mg by mouth as needed for heartburn.    . letrozole (FEMARA) 2.5 MG tablet Take 1 tablet (2.5 mg total) by mouth daily. 30 tablet 6   No current facility-administered medications for this visit.     PHYSICAL EXAMINATION:  ECOG PERFORMANCE STATUS: 0 - Asymptomatic   Vitals:   04/30/17 1402  BP: 136/88  Pulse: 73  Resp: (!) 97    Filed Weights   04/30/17 1402  Weight: 248 lb 3.2 oz (112.6 kg)     Physical Exam GENERAL: No distress, well nourished.  SKIN:  No rashes or significant lesions  HEAD: Normocephalic, No masses, lesions, tenderness or abnormalities  EYES: Conjunctiva are pink, non icteric ENT: External ears normal ,lips , buccal mucosa, and tongue normal and mucous membranes are moist  LYMPH: No palpable cervical and axillary lymphadenopathy  LUNGS: Clear to auscultation, no crackles or wheezes HEART: Regular rate & rhythm, no murmurs, no gallops, S1 normal and S2 normal  ABDOMEN: Abdomen soft, non-tender, normal bowel sounds, I did not appreciate any  masses or organomegaly  MUSCULOSKELETAL: No CVA tenderness and no tenderness on percussion of the back or rib cage.  EXTREMITIES: No edema, no skin discoloration or tenderness NEURO: Alert & oriented, no focal motor/sensory deficits. Breast exam was performed in seated and lying down position. Patient is status post left breast lumpectomy with a well-healed surgical scar. No evidence of any palpable masses. No evidence of axillary adenopathy. No evidence of any palpable masses or lumps in the right breast. No evidence of right axillary adenopathy   LABORATORY DATA: I have personally reviewed the data as listed: Pathology 7/18  Surgical Pathology   THIS IS AN ADDENDUM REPORT   CASE: [id number]  PATIENT: [facility]  Surgical Pathology Report  Reason for Addendum #1: Immunohistochemistry results   RADIOGRAPHIC STUDIES: I have personally reviewed the radiological images as listed and agree with the findings in the report 01/28/2017 Mammogram screening FINDINGS: In the left breast, a possible mass warrants further evaluation. In the right breast, no findings suspicious for malignancy. IMPRESSION: Further evaluation is suggested for possible mass in the left breast. 02/08/2017 Mammogram diagnostic left IMPRESSION: 1. Highly suspicious left breast mass at 1:30, 7 cm from the nipple. 2. Indeterminate mass at 1 o'clock, 6 cm from the nipple.  02/08/2017 2D DIGITAL DIAGNOSTIC LEFT MAMMOGRAM WITH CAD AND ADJUNCT TOMO ULTRASOUND LEFT BREAST COMPARISON:  Previous exam(s). ACR Breast Density Category b: There are scattered areas of fibroglandular density. FINDINGS: Additional views of the left breast demonstrates a persistent spiculated mass in the upper, outer left breast, corresponding to the mass seen on screening mammogram. There is a smaller, adjacent oval mass within  the slightly more medial upper, outer left breast. Mammographic images were processed with CAD. On physical exam, there is palpable thickening at 1:30, 7 cm from the nipple in the area of concern. Ultrasound demonstrates an irregular, hypoechoic mass measuring 12 x 7 x 10 mm at 1:30, 7 cm from the nipple, corresponding to the spiculated mass seen mammographically. There is an adjacent near anechoic mass at 1 o'clock, 6 cm from the nipple measuring 6 x 4 x 4 mm, corresponding to the additional mass seen mammographically. Ultrasound of the left axilla demonstrates no suspicious appearing axillary lymph nodes. IMPRESSION: 1. Highly suspicious left breast mass at 1:30, 7 cm from the nipple. 2. Indeterminate mass at 1 o'clock, 6 cm from the nipple. EXAM: ULTRASOUND GUIDED LEFT BREAST CORE  NEEDLE BIOPSY COMPARISON:  Previous exam(s). FINDINGS: I met with the patient and we discussed the procedure of ultrasound-guided biopsy, including benefits and alternatives. We discussed the high likelihood of a successful procedure. We discussed the risks of the procedure, including infection, bleeding, tissue injury, clip migration, and inadequate sampling. Informed written consent was given. The usual time-out protocol was performed immediately prior to the procedure.  Lesion quadrant: Upper outer quadrant  Using sterile technique and 1% Lidocaine as local anesthetic, under direct ultrasound visualization, a 12 gauge spring-loaded device was used to perform biopsy of the highly suspicious mass in the 1:30 o'clock position using an inferior approach. At the conclusion of the procedure a coil shaped tissue marker clip was deployed into the biopsy cavity. Follow up 2 view mammogram was performed and dictated separately.  Following the local anesthesia, the indeterminate mass, that was localized at the 1 o'clock position, could no longer be visualized. This appears to have been disrupted with the local anesthesia needle or possibly obscured by the infiltrated lidocaine. This strongly supports this lesion being benign cyst or dilated duct. IMPRESSION: Ultrasound guided biopsy of a left breast mass. No apparent Complications.  ASSESSMENT/PLAN Cancer Staging Malignant neoplasm of upper-outer quadrant of left breast in female, estrogen receptor positive ([place]) Staging form: Breast, AJCC 8th Edition - Clinical stage from 03/03/2017: Stage IA (cT1c, cN0, cM0, G1, ER: Positive, PR: Positive, HER2: Equivocal) - Signed by Earlie Server, MD on 03/03/2017  1. Use of letrozole (Femara)   2. Malignant neoplasm of upper-outer quadrant of left breast in female, estrogen receptor positive ([place])         # Discussed with patient that she has Stage IA breast cancer. Mammaprint indicate low risk. Will  not offer chemotherapy    She has completed lumpectomy followed by MammoSite radiation.     Discussed about hormonal treatment with Aromatase inhibitor benefit and side effects. Patient is willing to start. Letrozole 2.'5mg'$  daily Rx provided to patient.  # will check baseline bone density.   Orders Placed This Encounter  Procedures  . DG Bone Density    Standing Status:   Future    Standing Expiration Date:   06/30/2018    Order Specific Question:   Reason for Exam (SYMPTOM  OR DIAGNOSIS REQUIRED)    Answer:   on letrozole    Order Specific Question:   Is the patient pregnant?    Answer:   No    Order Specific Question:   Preferred imaging location?    Answer:   [facility]   Follow up in 2 weeks or after bone density to discuss need of bisphosphonate.  All questions were answered. The patient knows to call the clinic with any  problems, questions or concerns.  Earlie Server MD PhD [facility] Oncology [facility] at [facility] [phone] Phone: [phone] 02/22/2017

## 2017-05-27 ENCOUNTER — Encounter: Payer: Self-pay | Admitting: *Deleted

## 2017-05-27 ENCOUNTER — Encounter: Payer: Self-pay | Admitting: Radiation Oncology

## 2017-05-27 ENCOUNTER — Ambulatory Visit
Admission: RE | Admit: 2017-05-27 | Discharge: 2017-05-27 | Disposition: A | Discharge status: Home / Self Care | Payer: BLUE CROSS/BLUE SHIELD | Source: Ambulatory Visit | Attending: Radiation Oncology | Admitting: Radiation Oncology

## 2017-05-27 VITALS — BP 135/85 | HR 73 | Temp 97.5°F | Resp 18 | Wt 248.0 lb

## 2017-05-27 DIAGNOSIS — Z170 Estrogen receptor positive status [ER+]: Principal | ICD-10-CM

## 2017-05-27 DIAGNOSIS — C50412 Malignant neoplasm of upper-outer quadrant of left female breast: Secondary | ICD-10-CM

## 2017-05-27 NOTE — Progress Notes (Signed)
Radiation Oncology Follow up Note  Name: MITRA DULING   Date:   05/27/2017 MRN:  [id number] DOB: September 12, 1960    This 56 y.o. female presents to the clinic today for one-month follow-up status post accelerated partial breast radiation to her left breast for stage I ER/PR positive invasive mammary carcinoma.  REFERRING PROVIDER: Linus Salmons, PA*  HPI: Patient is a 56 year old female now seen out 1 month having completed accelerated partial breast radiation to her left breast for ER/PR positive HER-2/neu negative invasive mammary carcinoma status post wide local excision. Seen today in routine follow up she is doing well. She specifically denies breast tenderness cough or bone pain. She's having some trouble with plantar fasciitis I've wrecked recommendedfinn comfort shoes which she will be exploring. She has been started on .Femara tolerating that well with some increase in hot flashes.  COMPLICATIONS OF TREATMENT: none  FOLLOW UP COMPLIANCE: keeps appointments   PHYSICAL EXAM:  BP 135/85   Pulse 73   Temp (!) 97.5 F (36.4 C)   Resp 18   Wt 248 lb 0.3 oz (112.5 kg)   LMP 03/19/2011 (Approximate) Comment: LMP 2011 or 2012  BMI 38.85 kg/m  Lungs are clear to A&P cardiac examination essentially unremarkable with regular rate and rhythm. No dominant mass or nodularity is noted in either breast in 2 positions examined. Incision is well-healed. No axillary or supraclavicular adenopathy is appreciated. Cosmetic result is excellent. Well-developed well-nourished patient in NAD. HEENT reveals PERLA, EOMI, discs not visualized.  Oral cavity is clear. No oral mucosal lesions are identified. Neck is clear without evidence of cervical or supraclavicular adenopathy. Lungs are clear to A&P. Cardiac examination is essentially unremarkable with regular rate and rhythm without murmur rub or thrill. Abdomen is benign with no organomegaly or masses noted. Motor sensory and DTR levels are equal and  symmetric in the upper and lower extremities. Cranial nerves II through XII are grossly intact. Proprioception is intact. No peripheral adenopathy or edema is identified. No motor or sensory levels are noted. Crude visual fields are within normal range.  RADIOLOGY RESULTS: No current films for review  PLAN: At the present time patient is doing well 1 month out from accelerated partial breast radiation. I'm please were overall progress. She continues on Femara without side effect. I've asked to see her back in 4-5 months for follow-up. Patient is to call sooner with any concerns.  I would like to take this opportunity to thank you for allowing me to participate in the care of your patient.Armstead Peaks., MD

## 2017-07-28 ENCOUNTER — Ambulatory Visit (INDEPENDENT_AMBULATORY_CARE_PROVIDER_SITE_OTHER): Payer: BLUE CROSS/BLUE SHIELD | Admitting: General Surgery

## 2017-07-28 ENCOUNTER — Encounter: Payer: Self-pay | Admitting: General Surgery

## 2017-07-28 ENCOUNTER — Ambulatory Visit: Payer: BLUE CROSS/BLUE SHIELD | Admitting: General Surgery

## 2017-07-28 VITALS — BP 134/78 | HR 76 | Resp 14 | Ht 67.0 in | Wt 244.0 lb

## 2017-07-28 DIAGNOSIS — Z170 Estrogen receptor positive status [ER+]: Secondary | ICD-10-CM | POA: Diagnosis not present

## 2017-07-28 DIAGNOSIS — C50412 Malignant neoplasm of upper-outer quadrant of left female breast: Secondary | ICD-10-CM

## 2017-07-28 NOTE — Progress Notes (Signed)
Patient ID: Norma Austin, female   DOB: 05-Apr-1961, 56 y.o.   MRN: [id number]  Chief Complaint  Patient presents with  . Breast Cancer    HPI Norma Austin is a 56 y.o. female here today for her three month follow up breast cancer. Patient states she is doing well. She is tolerating Letrozole but she is having hot flashes.                                         Marland KitchenHPI  Past Medical History:  Diagnosis Date  . Arthritis   . Breast cancer ([place]) 2018   INVASIVE MAMMARY CARCINOMA, T1c, N0, 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  . GERD (gastroesophageal reflux disease)   . Indigestion   . Plantar fasciitis     Past Surgical History:  Procedure Laterality Date  . BREAST BIOPSY Left 02/24/2017   INVASIVE MAMMARY CARCINOMA  . BREAST LUMPECTOMY WITH SENTINEL LYMPH NODE BIOPSY Left 03/18/2017   Procedure: BREAST LUMPECTOMY WITH SENTINEL LYMPH NODE BX;  Surgeon: Norma Lye, MD;  Location: [facility] ORS;  Service: General;  Laterality: Left;  . GANGLION CYST EXCISION  1990  . MOUTH SURGERY     4 BOTTOM FRONT DENTAL IMPLANTS    Family History  Problem Relation Age of Onset  . Breast cancer Mother 63  . Lung cancer Father   . Thyroid cancer Cousin   . Colon cancer Neg Hx     Social History Social History   Tobacco Use  . Smoking status: Former Smoker    Packs/day: 1.00    Years: 25.00    Pack years: 25.00    Types: Cigarettes    Last attempt to quit: 03/12/2007    Years since quitting: 10.3  . Smokeless tobacco: Never Used  Substance Use Topics  . Alcohol use: Yes    Alcohol/week: 0.0 oz    Comment: occasional  . Drug use: No    Allergies  Allergen Reactions  . Adhesive [Tape]     WHELPS    Current Outpatient Medications  Medication Sig Dispense Refill  . acetaminophen (TYLENOL) 500 MG tablet Take 1,000 mg by mouth every 6 (six) hours as needed (for pain.).    Marland Kitchen diclofenac (VOLTAREN) 50 MG EC tablet Take 50 mg by mouth 2 (two) times daily as needed (for plantar  fasciitis/arthritis.).     Marland Kitchen letrozole (FEMARA) 2.5 MG tablet Take 1 tablet (2.5 mg total) by mouth daily. 30 tablet 6  . loratadine (CLARITIN) 10 MG tablet Take 10 mg by mouth daily.    . ranitidine (ZANTAC) 150 MG tablet Take 150 mg by mouth as needed for heartburn.    . terbinafine (LAMISIL) 250 MG tablet Take 250 mg by mouth daily.     No current facility-administered medications for this visit.     Review of Systems Review of Systems  Constitutional: Negative.   Respiratory: Negative.   Cardiovascular: Negative.     Blood pressure 134/78, pulse 76, resp. rate 14, height _0  (1.702 m), weight 244 lb (110.7 kg), last menstrual period 03/19/2011.  Physical Exam Physical Exam  Constitutional: She is oriented to person, place, and time. She appears well-developed and well-nourished.  Eyes: Conjunctivae are normal. No scleral icterus.  Neck: Neck supple.  Cardiovascular: Normal rate, regular rhythm and normal heart sounds.  Pulmonary/Chest: Effort normal and breath sounds normal. Right  breast exhibits no inverted nipple, no mass, no nipple discharge, no skin change and no tenderness. Left breast exhibits no inverted nipple, no mass, no nipple discharge, no skin change and no tenderness.  Well healed left breast lumpectomy site  Abdominal: Soft. Normal appearance.  Lymphadenopathy:    She has no cervical adenopathy.    She has no axillary adenopathy.  Neurological: She is alert and oriented to person, place, and time.  Skin: Skin is warm and dry.  Psychiatric: She has a normal mood and affect.    Data Reviewed Prior notes reviewed   Assessment    Left breast invasive mammary carcinoma - T1c,N0,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. Mammoprint with low risk.   4 mos post lumpectomy/SN biopsy and radiation  Plan    Bilateral Diagnostic Mammogram and follow up with Norma Austin in June 2019    HPI, Physical Exam, Assessment and Plan have been scribed under the direction  and in the presence of Norma Jewel, MD  Norma Living, LPN  I have completed the exam and reviewed the above documentation for accuracy and completeness.  I agree with the above.  Haematologist has been used and any errors in dictation or transcription are unintentional.  Norma Austin G. Norma Austin, M.D., F.A.C.S.    Junie Panning G 07/29/2017, 8:49 AM

## 2017-07-28 NOTE — Patient Instructions (Signed)
Bilateral Diagnostic Mammogram and follow up with Dr Bary Castilla in June 2019 Continue self breast exams. Call office for any new breast issues or concerns.

## 2017-11-10 IMAGING — MG MM BREAST LOCALIZATION CLIP
2 series · 2 of 2 positions shown · non-contrast
Comparison: Previous exam(s).

CLINICAL DATA: Status post ultrasound-guided core needle biopsy of
an upper outer quadrant left breast mass.

EXAM:
DIAGNOSTIC LEFT MAMMOGRAM POST ULTRASOUND BIOPSY

[L CC]
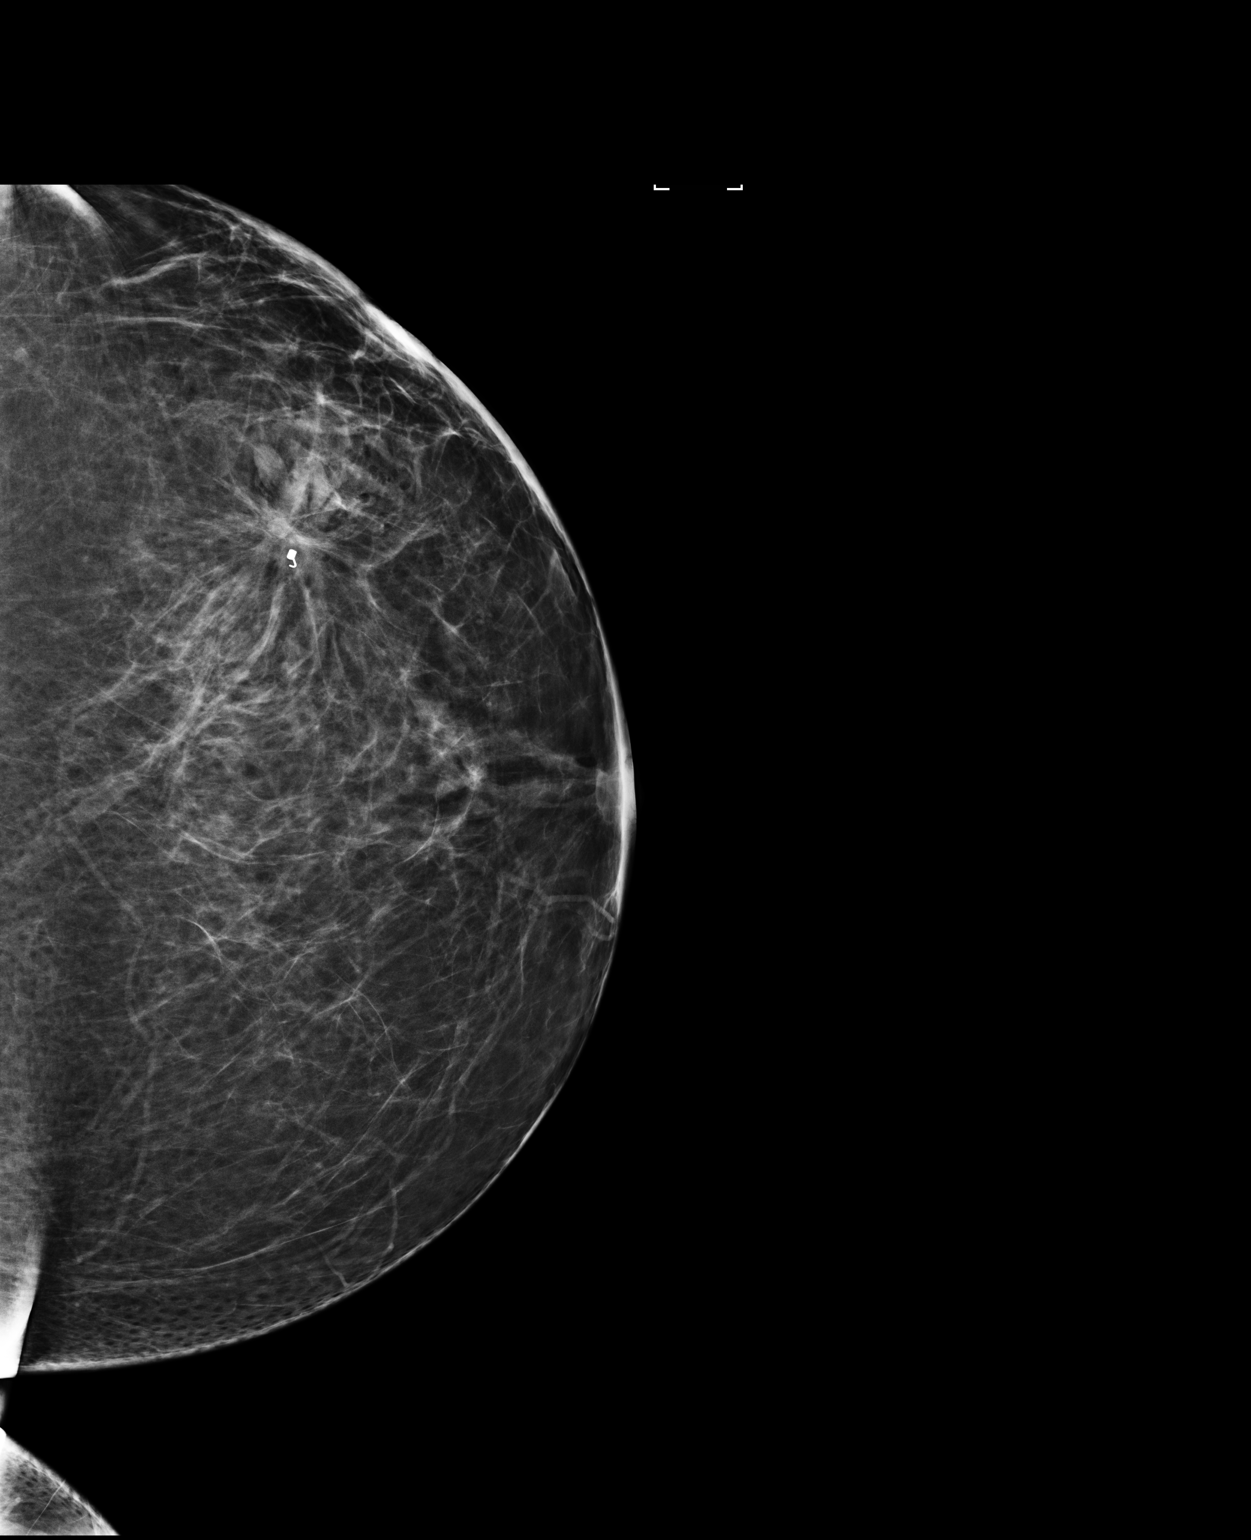

[L ML]
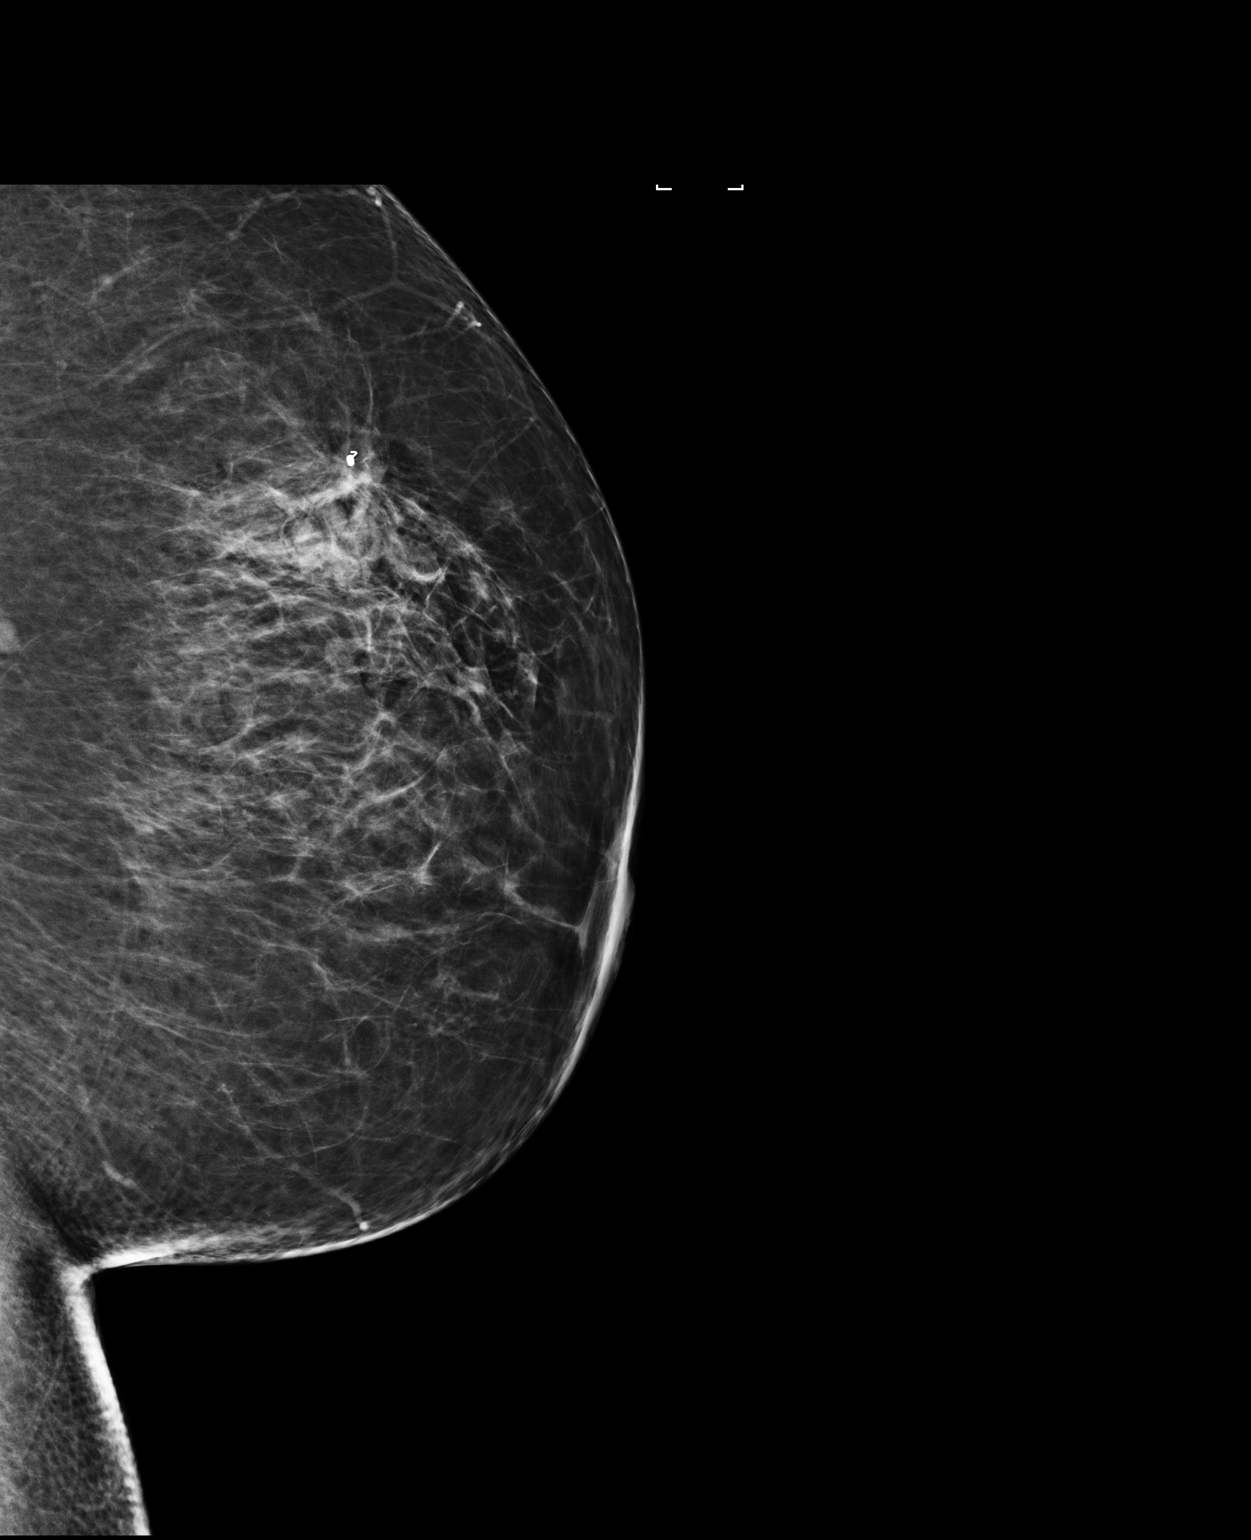

[2 of 2 positions shown; findings below may reference images not displayed]

FINDINGS: Mammographic images were obtained following ultrasound guided biopsy
of a left breast mass. The coil shaped biopsy clip lies along the
upper inner margin of the spiculated upper-outer quadrant mass.
IMPRESSION: Well-positioned coil shaped biopsy clip following ultrasound-guided
core needle biopsy of a left breast mass.

Final Assessment: Post Procedure Mammograms for Marker Placement

## 2017-11-18 ENCOUNTER — Other Ambulatory Visit: Payer: Self-pay

## 2017-11-18 DIAGNOSIS — C50412 Malignant neoplasm of upper-outer quadrant of left female breast: Secondary | ICD-10-CM

## 2017-11-18 DIAGNOSIS — Z170 Estrogen receptor positive status [ER+]: Principal | ICD-10-CM

## 2017-11-25 ENCOUNTER — Ambulatory Visit: Payer: BLUE CROSS/BLUE SHIELD | Admitting: Radiation Oncology

## 2017-11-26 ENCOUNTER — Other Ambulatory Visit: Payer: Self-pay

## 2017-11-26 ENCOUNTER — Ambulatory Visit
Admission: RE | Admit: 2017-11-26 | Discharge: 2017-11-26 | Disposition: A | Discharge status: Home / Self Care | Payer: BLUE CROSS/BLUE SHIELD | Source: Ambulatory Visit | Attending: Radiation Oncology | Admitting: Radiation Oncology

## 2017-11-26 ENCOUNTER — Encounter: Payer: Self-pay | Admitting: Radiation Oncology

## 2017-11-26 VITALS — BP 123/80 | HR 76 | Temp 96.6°F | Resp 20 | Wt 242.2 lb

## 2017-11-26 DIAGNOSIS — Z853 Personal history of malignant neoplasm of breast: Secondary | ICD-10-CM | POA: Insufficient documentation

## 2017-11-26 DIAGNOSIS — Z170 Estrogen receptor positive status [ER+]: Secondary | ICD-10-CM | POA: Diagnosis present

## 2017-11-26 DIAGNOSIS — C50412 Malignant neoplasm of upper-outer quadrant of left female breast: Secondary | ICD-10-CM

## 2017-11-26 DIAGNOSIS — C50912 Malignant neoplasm of unspecified site of left female breast: Secondary | ICD-10-CM | POA: Diagnosis present

## 2017-11-26 DIAGNOSIS — Z923 Personal history of irradiation: Secondary | ICD-10-CM | POA: Insufficient documentation

## 2017-11-26 DIAGNOSIS — Z08 Encounter for follow-up examination after completed treatment for malignant neoplasm: Secondary | ICD-10-CM | POA: Diagnosis not present

## 2017-11-26 NOTE — Progress Notes (Signed)
Radiation Oncology Follow up Note  Name: Norma Austin   Date:   11/26/2017 MRN:  [id number] DOB: 1961/04/13    This 57 y.o. female presents to the clinic today for 6 month follow-up status post accelerated partial breast radiation to her left breast for stage I ER/PR positive invasive mammary carcinoma.Marland Kitchen  REFERRING PROVIDER: Linus Salmons, PA*  HPI: patient is a 58 year old female now out 6 months having completed accelerated partial breast radiation to her left breast for stage I ER/PR positive invasive mammary carcinoma. She is seen today in routine follow-up and is doing well. She specifically denies breast tenderness cough or bone pain. She is currently on.Femara tolerate that well without side effect. She is having some hot flashes I've suggested vitamin E supplements.she scheduled for follow-up mammograms in June.  COMPLICATIONS OF TREATMENT: none  FOLLOW UP COMPLIANCE: keeps appointments   PHYSICAL EXAM:  BP 123/80   Pulse 76   Temp (!) 96.6 F (35.9 C)   Resp 20   Wt 242 lb 2.8 oz (109.8 kg)   LMP 03/19/2011 (Approximate) Comment: LMP 2011 or 2012  BMI 37.93 kg/m  Lungs are clear to A&P cardiac examination essentially unremarkable with regular rate and rhythm. No dominant mass or nodularity is noted in either breast in 2 positions examined. Incision is well-healed. No axillary or supraclavicular adenopathy is appreciated. Cosmetic result is excellent. Well-developed well-nourished patient in NAD. HEENT reveals PERLA, EOMI, discs not visualized.  Oral cavity is clear. No oral mucosal lesions are identified. Neck is clear without evidence of cervical or supraclavicular adenopathy. Lungs are clear to A&P. Cardiac examination is essentially unremarkable with regular rate and rhythm without murmur rub or thrill. Abdomen is benign with no organomegaly or masses noted. Motor sensory and DTR levels are equal and symmetric in the upper and lower extremities. Cranial nerves II  through XII are grossly intact. Proprioception is intact. No peripheral adenopathy or edema is identified. No motor or sensory levels are noted. Crude visual fields are within normal range.  RADIOLOGY RESULTS: no current films for review  PLAN: present time patient is doing well she is a rescheduled follow-up mammograms in June. She continues on Femara without side effect except for hot flashes for which I recommended vitamin E supplements. I've asked to see her back in 6 months and will start once your follow-up visits. Patient is to call at anytime with any concerns.  I would like to take this opportunity to thank you for allowing me to participate in the care of your patient.Noreene Filbert, MD

## 2017-12-08 ENCOUNTER — Other Ambulatory Visit: Payer: Self-pay | Admitting: Oncology

## 2017-12-08 ENCOUNTER — Encounter: Payer: Self-pay | Admitting: Oncology

## 2017-12-08 MED ORDER — LETROZOLE 2.5 MG PO TABS: 2.5000 mg | ORAL_TABLET | Freq: Every day | ORAL | 0 refills | Status: DC

## 2017-12-09 ENCOUNTER — Ambulatory Visit
Admission: RE | Admit: 2017-12-09 | Discharge: 2017-12-09 | Disposition: A | Discharge status: Home / Self Care | Payer: BLUE CROSS/BLUE SHIELD | Source: Ambulatory Visit | Attending: Oncology | Admitting: Oncology

## 2017-12-09 DIAGNOSIS — M8588 Other specified disorders of bone density and structure, other site: Secondary | ICD-10-CM | POA: Diagnosis not present

## 2017-12-09 DIAGNOSIS — Z79811 Long term (current) use of aromatase inhibitors: Secondary | ICD-10-CM | POA: Diagnosis present

## 2017-12-09 HISTORY — DX: Personal history of irradiation: Z92.3

## 2017-12-13 ENCOUNTER — Inpatient Hospital Stay: Payer: BLUE CROSS/BLUE SHIELD | Attending: Oncology

## 2017-12-13 ENCOUNTER — Other Ambulatory Visit: Payer: Self-pay | Admitting: *Deleted

## 2017-12-13 ENCOUNTER — Other Ambulatory Visit: Payer: Self-pay

## 2017-12-13 DIAGNOSIS — Z79811 Long term (current) use of aromatase inhibitors: Secondary | ICD-10-CM | POA: Diagnosis not present

## 2017-12-13 DIAGNOSIS — Z170 Estrogen receptor positive status [ER+]: Secondary | ICD-10-CM | POA: Diagnosis not present

## 2017-12-13 DIAGNOSIS — M8588 Other specified disorders of bone density and structure, other site: Secondary | ICD-10-CM | POA: Insufficient documentation

## 2017-12-13 DIAGNOSIS — C50412 Malignant neoplasm of upper-outer quadrant of left female breast: Secondary | ICD-10-CM | POA: Diagnosis present

## 2017-12-13 LAB — COMPREHENSIVE METABOLIC PANEL
ALK PHOS: 63 U/L (ref 38–126)
ALT: 22 U/L (ref 14–54)
ANION GAP: 7 (ref 5–15)
AST: 19 U/L (ref 15–41)
Albumin: 4.1 g/dL (ref 3.5–5.0)
BILIRUBIN TOTAL: 0.7 mg/dL (ref 0.3–1.2)
BUN: 18 mg/dL (ref 6–20)
CALCIUM: 9.3 mg/dL (ref 8.9–10.3)
CO2: 23 mmol/L (ref 22–32)
Chloride: 106 mmol/L (ref 101–111)
Creatinine, Ser: 0.88 mg/dL (ref 0.44–1.00)
Glucose, Bld: 117 mg/dL — ABNORMAL HIGH (ref 65–99)
Potassium: 3.9 mmol/L (ref 3.5–5.1)
SODIUM: 136 mmol/L (ref 135–145)
Total Protein: 7.6 g/dL (ref 6.5–8.1)

## 2017-12-13 LAB — CBC WITH DIFFERENTIAL/PLATELET
BASOS ABS: 0.1 10*3/uL (ref 0–0.1)
BASOS PCT: 1 %
Eosinophils Absolute: 0.2 10*3/uL (ref 0–0.7)
Eosinophils Relative: 2 %
HEMATOCRIT: 39.3 % (ref 35.0–47.0)
HEMOGLOBIN: 14 g/dL (ref 12.0–16.0)
Lymphocytes Relative: 23 %
Lymphs Abs: 1.6 10*3/uL (ref 1.0–3.6)
MCH: 31.1 pg (ref 26.0–34.0)
MCHC: 35.5 g/dL (ref 32.0–36.0)
MCV: 87.6 fL (ref 80.0–100.0)
Monocytes Absolute: 0.4 10*3/uL (ref 0.2–0.9)
Monocytes Relative: 6 %
NEUTROS ABS: 4.8 10*3/uL (ref 1.4–6.5)
NEUTROS PCT: 68 %
Platelets: 191 10*3/uL (ref 150–440)
RBC: 4.49 MIL/uL (ref 3.80–5.20)
RDW: 12.6 % (ref 11.5–14.5)
WBC: 7 10*3/uL (ref 3.6–11.0)

## 2017-12-14 ENCOUNTER — Other Ambulatory Visit: Payer: Self-pay

## 2017-12-14 ENCOUNTER — Inpatient Hospital Stay (HOSPITAL_BASED_OUTPATIENT_CLINIC_OR_DEPARTMENT_OTHER): Payer: BLUE CROSS/BLUE SHIELD | Admitting: Oncology

## 2017-12-14 ENCOUNTER — Encounter: Payer: Self-pay | Admitting: Oncology

## 2017-12-14 VITALS — BP 135/85 | HR 67 | Temp 95.9°F | Ht 67.0 in | Wt 243.4 lb

## 2017-12-14 DIAGNOSIS — Z79811 Long term (current) use of aromatase inhibitors: Secondary | ICD-10-CM | POA: Diagnosis not present

## 2017-12-14 DIAGNOSIS — C50412 Malignant neoplasm of upper-outer quadrant of left female breast: Secondary | ICD-10-CM

## 2017-12-14 DIAGNOSIS — Z170 Estrogen receptor positive status [ER+]: Secondary | ICD-10-CM | POA: Diagnosis not present

## 2017-12-14 DIAGNOSIS — C50912 Malignant neoplasm of unspecified site of left female breast: Secondary | ICD-10-CM

## 2017-12-14 DIAGNOSIS — M8588 Other specified disorders of bone density and structure, other site: Secondary | ICD-10-CM | POA: Diagnosis not present

## 2017-12-14 NOTE — Progress Notes (Signed)
[facility] Consultation Note  Patient Care Team: Sifferman, Grafton Folk as PCP - General (Physician Assistant) [name], MD as Referring Physician (Family Medicine) [name], MD (General Surgery)  REASON FOR VISIT Follow up for treatment of Stage IA breast cancer  HISTORY OF PRESENTING ILLNESS: 1 Norma Austin 57 y.o. female with PMH listed as below is referred by Dr.Cook to cancer center for evaluation and management of newly diagnosed breast caner.  Patient had routine mammogram screening which showed left breast mass. This was followed by a diagnostic Mammogram and ultrasound which showed an hypoechoic mass measuring 12 x 7 x 10 mm at 1:30, 7 cm from the nipple, corresponding to thespiculated mass seen mammographically. There is an adjacent near anechoic mass at 1 o'clock, 6 cm from the nipple measuring 6 x 4 x 4 mm. 1:30 mass was biopsy and showed invasive carcinoma of breast, unspecified type, no LVI, No DCIS. The 1 o'clock mass disappeared after biopsy of the 1:30 mass. It maybe a cyst which was poked by the needle for anesthesia.   2 Pathology: 02/24/2017 US guided breast mass biopsy showed invasive mammary carcinoma, grade 1, ER 90% PR90%, HER2 Equivocal by IHC, negative by FISH. S/p left breast lumpectomy and sentinel lymph node biopsy on 03/18/2017. Pathology showed 21m invasive mammary carcinoma of no specific type, margins are all negative, and all 3 sentinel lymph nodes were negative. ER/PR 90%, HER2 FISH negative. Mammaprint showed low risk.  3  lumpectomy followed by MammoSite radiation. Mammaprint indicates low risk. Adjuvant chemotherapy is not offered.   Interval History:  Patient presents to clinic for follow up visit. Patient did not show up for her follow up appointment after last visit in September. She takes letrozole daily. Hot flush is manageable. Some joint pain. Had bone density done recently.    Review of Systems   Constitutional: Negative for chills, fever, malaise/fatigue and weight loss.  HENT: Negative for congestion, ear discharge, ear pain, nosebleeds, sinus pain and sore throat.   Eyes: Negative for double vision, photophobia, pain, discharge and redness.  Respiratory: Negative for cough, hemoptysis, sputum production, shortness of breath and wheezing.   Cardiovascular: Negative for chest pain, palpitations, orthopnea, claudication and leg swelling.  Gastrointestinal: Negative for abdominal pain, blood in stool, constipation, diarrhea, heartburn, melena, nausea and vomiting.  Genitourinary: Negative for dysuria, flank pain, frequency and hematuria.  Musculoskeletal: Positive for joint pain. Negative for back pain, myalgias and neck pain.  Skin: Negative for itching and rash.  Neurological: Negative for dizziness, tingling, tremors, focal weakness, weakness and headaches.  Endo/Heme/Allergies: Negative for environmental allergies. Does not bruise/bleed easily.       Hot flush  Psychiatric/Behavioral: Negative for depression and hallucinations. The patient is not nervous/anxious.     MEDICAL HISTORY: Past Medical History:  Diagnosis Date  . Arthritis   . Breast cancer ([place] 2018   INVASIVE MAMMARY CARCINOMA, T1c, N0, 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  . GERD (gastroesophageal reflux disease)   . Indigestion   . Personal history of radiation therapy 2018   F/U left breast cancer  . Plantar fasciitis     SURGICAL HISTORY: Past Surgical History:  Procedure Laterality Date  . BREAST BIOPSY Left 02/24/2017   INVASIVE MAMMARY CARCINOMA  . BREAST LUMPECTOMY Left 2018   Invasive, F/U with radiation and Letrozole   . BREAST LUMPECTOMY WITH SENTINEL LYMPH NODE BIOPSY Left 03/18/2017   Procedure: BREAST LUMPECTOMY WITH SENTINEL LYMPH NODE BX;  Surgeon: [name], MD;  Location: [facility] ORS;  Service: General;  Laterality: Left;  . GANGLION CYST EXCISION  1990  . MOUTH SURGERY      4 BOTTOM FRONT DENTAL IMPLANTS    SOCIAL HISTORY: Social History   Socioeconomic History  . Marital status: Married    Spouse name: Not on file  . Number of children: Not on file  . Years of education: Not on file  . Highest education level: Not on file  Occupational History  . Not on file  Social Needs  . Financial resource strain: Not on file  . Food insecurity:    Worry: Not on file    Inability: Not on file  . Transportation needs:    Medical: Not on file    Non-medical: Not on file  Tobacco Use  . Smoking status: Former Smoker    Packs/day: 1.00    Years: 25.00    Pack years: 25.00    Types: Cigarettes    Last attempt to quit: 03/12/2007    Years since quitting: 10.7  . Smokeless tobacco: Never Used  Substance and Sexual Activity  . Alcohol use: Yes    Alcohol/week: 0.0 oz    Comment: occasional  . Drug use: No  . Sexual activity: Not on file  Lifestyle  . Physical activity:    Days per week: Not on file    Minutes per session: Not on file  . Stress: Not on file  Relationships  . Social connections:    Talks on phone: Not on file    Gets together: Not on file    Attends religious service: Not on file    Active member of club or organization: Not on file    Attends meetings of clubs or organizations: Not on file    Relationship status: Not on file  . Intimate partner violence:    Fear of current or ex partner: Not on file    Emotionally abused: Not on file    Physically abused: Not on file    Forced sexual activity: Not on file  Other Topics Concern  . Not on file  Social History Narrative  . Not on file    FAMILY HISTORY Family History  Problem Relation Age of Onset  . Breast cancer Mother 41  . Lung cancer Father   . Thyroid cancer Cousin   . Colon cancer Neg Hx     ALLERGIES:  is allergic to adhesive [tape].  MEDICATIONS:  Current Outpatient Medications  Medication Sig Dispense Refill  . acetaminophen (TYLENOL) 500 MG tablet Take 1,000  mg by mouth every 6 (six) hours as needed (for pain.).    Marland Kitchen diclofenac (VOLTAREN) 50 MG EC tablet Take 50 mg by mouth 2 (two) times daily as needed (for plantar fasciitis/arthritis.).     Marland Kitchen letrozole (FEMARA) 2.5 MG tablet Take 1 tablet (2.5 mg total) by mouth daily. 30 tablet 0  . loratadine (CLARITIN) 10 MG tablet Take 10 mg by mouth daily.    . ranitidine (ZANTAC) 150 MG tablet Take 150 mg by mouth as needed for heartburn.    . terbinafine (LAMISIL) 250 MG tablet Take 250 mg by mouth daily.     No current facility-administered medications for this visit.     PHYSICAL EXAMINATION:  ECOG PERFORMANCE STATUS: 0 - Asymptomatic   Vitals:   12/14/17 0839  BP: 135/85  Pulse: 67  Temp: (!) 95.9 F (35.5 C)    Filed Weights  12/14/17 0839  Weight: 243 lb 7 oz (110.4 kg)     Physical Exam  Constitutional: She is oriented to person, place, and time and well-developed, well-nourished, and in no distress. No distress.  HENT:  Head: Normocephalic and atraumatic.  Nose: Nose normal.  Mouth/Throat: Oropharynx is clear and moist. No oropharyngeal exudate.  Eyes: Pupils are equal, round, and reactive to light. EOM are normal. Left eye exhibits no discharge. No scleral icterus.  Neck: Normal range of motion. Neck supple. No JVD present.  Cardiovascular: Normal rate, regular rhythm and normal heart sounds.  No murmur heard. Pulmonary/Chest: Effort normal and breath sounds normal. No respiratory distress. She has no wheezes. She has no rales. She exhibits no tenderness.  Abdominal: Soft. She exhibits no distension and no mass. There is no tenderness. There is no rebound.  Musculoskeletal: Normal range of motion. She exhibits no edema or tenderness.  Lymphadenopathy:    She has no cervical adenopathy.  Neurological: She is alert and oriented to person, place, and time. No cranial nerve deficit. She exhibits normal muscle tone. Coordination normal.  Skin: Skin is warm and dry. No rash noted.  She is not diaphoretic. No erythema.  Psychiatric: Affect and judgment normal.  Breast exam was performed in seated and lying down position. Patient is status post lumpectomy of left breast  with a well-healed surgical scar. No evidence of any palpable masses bilateral . No evidence of bilateral axillary adenopathy.    LABORATORY DATA: I have personally reviewed the data as listed: Pathology 7/18  Surgical Pathology   THIS IS AN ADDENDUM REPORT  CASE: [id number]  PATIENT: [facility]  Surgical Pathology Report  Reason for Addendum #1: Immunohistochemistry results   RADIOGRAPHIC STUDIES: I have personally reviewed the radiological images as listed and agree with the findings in the report 01/28/2017 Mammogram screening FINDINGS: In the left breast, a possible mass warrants further evaluation. In the right breast, no findings suspicious for malignancy. IMPRESSION: Further evaluation is suggested for possible mass in the left breast. 02/08/2017 Mammogram diagnostic left IMPRESSION: 1. Highly suspicious left breast mass at 1:30, 7 cm from the nipple. 2. Indeterminate mass at 1 o'clock, 6 cm from the nipple.  02/08/2017 2D DIGITAL DIAGNOSTIC LEFT MAMMOGRAM WITH CAD AND ADJUNCT TOMO ULTRASOUND LEFT BREAST COMPARISON:  Previous exam(s). ACR Breast Density Category b: There are scattered areas of fibroglandular density. FINDINGS: Additional views of the left breast demonstrates a persistent spiculated mass in the upper, outer left breast, corresponding to the mass seen on screening mammogram. There is a smaller, adjacent oval mass within the slightly more medial upper, outer left breast. Mammographic images were processed with CAD. On physical exam, there is palpable thickening at 1:30, 7 cm from the nipple in the area of concern. Ultrasound demonstrates an irregular, hypoechoic mass measuring 12 x 7 x 10 mm at 1:30, 7 cm from the nipple, corresponding to the spiculated mass  seen mammographically. There is an adjacent near anechoic mass at 1 o'clock, 6 cm from the nipple measuring 6 x 4 x 4 mm, corresponding to the additional mass seen mammographically. Ultrasound of the left axilla demonstrates no suspicious appearing axillary lymph nodes. IMPRESSION: 1. Highly suspicious left breast mass at 1:30, 7 cm from the nipple. 2. Indeterminate mass at 1 o'clock, 6 cm from the nipple. EXAM: ULTRASOUND GUIDED LEFT BREAST CORE NEEDLE BIOPSY COMPARISON:  Previous exam(s). FINDINGS: I met with the patient and we discussed the procedure of ultrasound-guided biopsy, including benefits and alternatives. We  discussed the high likelihood of a successful procedure. We discussed the risks of the procedure, including infection, bleeding, tissue injury, clip migration, and inadequate sampling. Informed written consent was given. The usual time-out protocol was performed immediately prior to the procedure.  Lesion quadrant: Upper outer quadrant  Using sterile technique and 1% Lidocaine as local anesthetic, under direct ultrasound visualization, a 12 gauge spring-loaded device was used to perform biopsy of the highly suspicious mass in the 1:30 o'clock position using an inferior approach. At the conclusion of the procedure a coil shaped tissue marker clip was deployed into the biopsy cavity. Follow up 2 view mammogram was performed and dictated separately.  Following the local anesthesia, the indeterminate mass, that was localized at the 1 o'clock position, could no longer be visualized. This appears to have been disrupted with the local anesthesia needle or possibly obscured by the infiltrated lidocaine. This strongly supports this lesion being benign cyst or dilated duct. IMPRESSION: Ultrasound guided biopsy of a left breast mass. No apparent Complications.  ASSESSMENT/PLAN Cancer Staging Malignant neoplasm of upper-outer quadrant of left breast in female,  estrogen receptor positive ([place]) Staging form: Breast, AJCC 8th Edition - Clinical stage from 03/03/2017: Stage IA (cT1c, cN0, cM0, G1, ER: Positive, PR: Positive, HER2: Equivocal) - Signed by Earlie Server, MD on 03/03/2017 - Pathologic stage from 04/30/2017: pT1c, pN0, cM0, ER: Positive, PR: Positive, HER2: Negative - Signed by Earlie Server, MD on 04/30/2017  1. Use of letrozole (Femara)   2. Malignant neoplasm of upper-outer quadrant of left breast in female, estrogen receptor positive ([place])   3. Er+ PR+ carcinoma of breast, left ([place])   4. Osteopenia of spine         # Tolerates Letrozole well. Continue. Plan 5 years at least, if tolerate will continue further depending on guideline at that time.  # Discussed with patient that she has osteopenia and Letrozole can worsen osteopenia.  Advise patient to get Zometa treatments every 6 months for osteopenia. Rationale and side effects of Zometa including but not limited to abnormal electrolytes, osteonecrosis of jaw discussed with patient. She voices understanding and willing to proceed. Recommend patient to go for a dental evaluation and fax [place] clearance letter. Patient needs to call cancer center and schedule zometa treatment.   # Advise patient to take OTC calcium and vitamin D supplements.  # Annual Mammogram, she has it scheduled in June.   Follow up in 6 months.  All questions were answered. The patient knows to call the clinic with any problems, questions or concerns.  Earlie Server MD PhD [facility] [phone] Phone: [phone] 12/14/17

## 2018-01-11 ENCOUNTER — Encounter: Payer: Self-pay | Admitting: Oncology

## 2018-01-12 ENCOUNTER — Other Ambulatory Visit: Payer: Self-pay | Admitting: Oncology

## 2018-01-12 MED ORDER — LETROZOLE 2.5 MG PO TABS: 2.5000 mg | ORAL_TABLET | Freq: Every day | ORAL | 0 refills | Status: DC

## 2018-01-25 ENCOUNTER — Inpatient Hospital Stay: Payer: BLUE CROSS/BLUE SHIELD | Admitting: Oncology

## 2018-01-25 ENCOUNTER — Inpatient Hospital Stay: Payer: BLUE CROSS/BLUE SHIELD

## 2018-01-26 ENCOUNTER — Other Ambulatory Visit: Payer: Self-pay | Admitting: Oncology

## 2018-02-01 ENCOUNTER — Ambulatory Visit
Admission: RE | Admit: 2018-02-01 | Discharge: 2018-02-01 | Disposition: A | Discharge status: Home / Self Care | Payer: BLUE CROSS/BLUE SHIELD | Source: Ambulatory Visit | Attending: General Surgery | Admitting: General Surgery

## 2018-02-01 DIAGNOSIS — Z170 Estrogen receptor positive status [ER+]: Principal | ICD-10-CM

## 2018-02-01 DIAGNOSIS — C50412 Malignant neoplasm of upper-outer quadrant of left female breast: Secondary | ICD-10-CM

## 2018-02-08 ENCOUNTER — Encounter: Payer: Self-pay | Admitting: General Surgery

## 2018-02-08 ENCOUNTER — Other Ambulatory Visit: Payer: Self-pay | Admitting: Oncology

## 2018-02-08 ENCOUNTER — Ambulatory Visit: Payer: BLUE CROSS/BLUE SHIELD | Admitting: General Surgery

## 2018-02-08 VITALS — BP 130/70 | HR 64 | Resp 13 | Ht 67.0 in | Wt 239.0 lb

## 2018-02-08 DIAGNOSIS — C50412 Malignant neoplasm of upper-outer quadrant of left female breast: Secondary | ICD-10-CM | POA: Diagnosis not present

## 2018-02-08 DIAGNOSIS — Z170 Estrogen receptor positive status [ER+]: Secondary | ICD-10-CM

## 2018-02-08 NOTE — Progress Notes (Signed)
Patient ID: Norma Austin, female   DOB: 25-Dec-1960, 57 y.o.   MRN: [id number]  Chief Complaint  Patient presents with  . Follow-up    HPI Norma Austin is a 57 y.o. female.  who presents for her breast cancer follow up and a breast evaluation. The most recent mammogram was done on 02-01-18.  Patient does perform regular self breast checks and gets regular mammograms done.   No new breast issues.  HPI  Past Medical History:  Diagnosis Date  . Arthritis   . Breast cancer ([place]) 2018   INVASIVE MAMMARY CARCINOMA, T1c, N0, 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  . GERD (gastroesophageal reflux disease)   . Indigestion   . Personal history of radiation therapy 2018   F/U left breast cancer  . Plantar fasciitis     Past Surgical History:  Procedure Laterality Date  . BREAST BIOPSY Left 02/24/2017   INVASIVE MAMMARY CARCINOMA  . BREAST LUMPECTOMY Left 2018   Invasive, F/U with radiation and Letrozole   . BREAST LUMPECTOMY WITH SENTINEL LYMPH NODE BIOPSY Left 03/18/2017   Procedure: BREAST LUMPECTOMY WITH SENTINEL LYMPH NODE BX;  Surgeon: [name], MD;  Location: [facility] ORS;  Service: General;  Laterality: Left;  . GANGLION CYST EXCISION  1990  . MOUTH SURGERY     4 BOTTOM FRONT DENTAL IMPLANTS    Family History  Problem Relation Age of Onset  . Breast cancer Mother 54  . Lung cancer Father   . Thyroid cancer Cousin   . Colon cancer Neg Hx     Social History Social History   Tobacco Use  . Smoking status: Former Smoker    Packs/day: 1.00    Years: 25.00    Pack years: 25.00    Types: Cigarettes    Last attempt to quit: 03/12/2007    Years since quitting: 10.9  . Smokeless tobacco: Never Used  Substance Use Topics  . Alcohol use: Yes    Alcohol/week: 0.0 oz    Comment: occasional  . Drug use: No    Allergies  Allergen Reactions  . Adhesive [Tape]     WHELPS    Current Outpatient Medications  Medication Sig Dispense Refill  . acetaminophen  (TYLENOL) 500 MG tablet Take 1,000 mg by mouth every 6 (six) hours as needed (for pain.).    Marland Kitchen diclofenac (VOLTAREN) 50 MG EC tablet Take 50 mg by mouth 2 (two) times daily as needed (for plantar fasciitis/arthritis.).     Marland Kitchen letrozole (FEMARA) 2.5 MG tablet TAKE 1 TABLET BY MOUTH EVERY DAY 30 tablet 0  . loratadine (CLARITIN) 10 MG tablet Take 10 mg by mouth daily.    . ranitidine (ZANTAC) 150 MG tablet Take 150 mg by mouth as needed for heartburn.     No current facility-administered medications for this visit.     Review of Systems Review of Systems  Constitutional: Negative.   Respiratory: Negative.   Cardiovascular: Negative.     Blood pressure 130/70, pulse 64, resp. rate 13, height 5' 7" (1.702 m), weight 239 lb (108.4 kg), last menstrual period 03/19/2011.  Physical Exam Physical Exam  Constitutional: She is oriented to person, place, and time. She appears well-developed and well-nourished.  Eyes: Conjunctivae are normal. No scleral icterus.  Neck: Neck supple.  Cardiovascular: Normal rate, regular rhythm and normal heart sounds.  Pulmonary/Chest: Effort normal and breath sounds normal. Right breast exhibits no inverted nipple, no mass, no nipple discharge, no skin change and  no tenderness. Left breast exhibits no inverted nipple, no mass, no nipple discharge, no skin change and no tenderness.    Lymphadenopathy:    She has no cervical adenopathy.    She has no axillary adenopathy.  Neurological: She is alert and oriented to person, place, and time.  Skin: Skin is warm and dry.    Data Reviewed Bilateral diagnostic mammograms dated February 01, 2018 were reviewed.  BI-RADS-2. Density of Dec 09, 2017 showed evidence of a spur osteopenia.  Patient undergoing dental evaluation to be sure no contraindication to Zometa.  Assessment    Evidence of recurrent cancer.    Plan  The patient has been asked to return to the office in one year with a bilateral diagnostic  mammogram.The patient is aware to call back for any questions or concerns.   HPI, Physical Exam, Assessment and Plan have been scribed under the direction and in the presence of Robert Bellow, MD. Karie Fetch, RN  I have completed the exam and reviewed the above documentation for accuracy and completeness.  I agree with the above.  Haematologist has been used and any errors in dictation or transcription are unintentional.  Hervey Ard, M.D., F.A.C.S.  Forest Gleason  02/09/2018, 7:18 AM

## 2018-02-08 NOTE — Progress Notes (Signed)
Patient ID: Norma Austin, female   DOB: 16-Aug-1960, 57 y.o.   MRN: [id number]  Chief Complaint  Patient presents with  . Follow-up    HPI [name] is a 57 y.o. female who presents for a breast evaluation. The most recent mammogram was done on 02/01/2018.   Patient does perform regular self breast checks and gets regular mammograms done.    HPI  Past Medical History:  Diagnosis Date  . Arthritis   . Breast cancer ([place]) 2018   INVASIVE MAMMARY CARCINOMA, T1c, N0, 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  . GERD (gastroesophageal reflux disease)   . Indigestion   . Personal history of radiation therapy 2018   F/U left breast cancer  . Plantar fasciitis     Past Surgical History:  Procedure Laterality Date  . BREAST BIOPSY Left 02/24/2017   INVASIVE MAMMARY CARCINOMA  . BREAST LUMPECTOMY Left 2018   Invasive, F/U with radiation and Letrozole   . BREAST LUMPECTOMY WITH SENTINEL LYMPH NODE BIOPSY Left 03/18/2017   Procedure: BREAST LUMPECTOMY WITH SENTINEL LYMPH NODE BX;  Surgeon: [name], MD;  Location: [facility] ORS;  Service: General;  Laterality: Left;  . GANGLION CYST EXCISION  1990  . MOUTH SURGERY     4 BOTTOM FRONT DENTAL IMPLANTS    Family History  Problem Relation Age of Onset  . Breast cancer Mother 10  . Lung cancer Father   . Thyroid cancer Cousin   . Colon cancer Neg Hx     Social History Social History   Tobacco Use  . Smoking status: Former Smoker    Packs/day: 1.00    Years: 25.00    Pack years: 25.00    Types: Cigarettes    Last attempt to quit: 03/12/2007    Years since quitting: 10.9  . Smokeless tobacco: Never Used  Substance Use Topics  . Alcohol use: Yes    Alcohol/week: 0.0 oz    Comment: occasional  . Drug use: No    Allergies  Allergen Reactions  . Adhesive [Tape]     WHELPS    Current Outpatient Medications  Medication Sig Dispense Refill  . acetaminophen (TYLENOL) 500 MG tablet Take 1,000 mg by mouth every 6 (six)  hours as needed (for pain.).    Marland Kitchen diclofenac (VOLTAREN) 50 MG EC tablet Take 50 mg by mouth 2 (two) times daily as needed (for plantar fasciitis/arthritis.).     Marland Kitchen letrozole (FEMARA) 2.5 MG tablet TAKE 1 TABLET BY MOUTH EVERY DAY 30 tablet 0  . loratadine (CLARITIN) 10 MG tablet Take 10 mg by mouth daily.    . ranitidine (ZANTAC) 150 MG tablet Take 150 mg by mouth as needed for heartburn.     No current facility-administered medications for this visit.     Review of Systems Review of Systems  Constitutional: Negative.   Respiratory: Negative.   Cardiovascular: Negative.     Blood pressure 130/70, pulse 64, resp. rate 13, height '5\' 7"'$  (1.702 m), weight 239 lb (108.4 kg), last menstrual period 03/19/2011.  Physical Exam Physical Exam  Constitutional: She is oriented to person, place, and time. She appears well-developed and well-nourished.  Eyes: Conjunctivae are normal. No scleral icterus.  Neck: Neck supple.  Cardiovascular: Normal rate, regular rhythm and normal heart sounds.  Pulmonary/Chest: Effort normal and breath sounds normal. Right breast exhibits no inverted nipple, no mass, no nipple discharge, no skin change and no tenderness. Left breast exhibits no inverted nipple, no mass,  no nipple discharge, no skin change and no tenderness.  Lymphadenopathy:    She has no cervical adenopathy.    She has no axillary adenopathy.  Neurological: She is alert and oriented to person, place, and time.  Skin: Skin is warm and dry.     Gaspar Cola, CMA  Gaspar Cola 02/08/2018, 10:06 AM

## 2018-02-08 NOTE — Patient Instructions (Addendum)
The patient has been asked to return to the office in one year with a bilateral diagnostic mammogram.The patient is aware to call back for any questions or concerns. 

## 2018-02-09 ENCOUNTER — Encounter: Payer: Self-pay | Admitting: General Surgery

## 2018-03-17 ENCOUNTER — Other Ambulatory Visit: Payer: Self-pay | Admitting: Oncology

## 2018-04-20 ENCOUNTER — Other Ambulatory Visit: Payer: Self-pay | Admitting: Oncology

## 2018-05-27 ENCOUNTER — Ambulatory Visit
Admission: RE | Admit: 2018-05-27 | Discharge: 2018-05-27 | Disposition: A | Discharge status: Home / Self Care | Payer: BLUE CROSS/BLUE SHIELD | Source: Ambulatory Visit | Attending: Radiation Oncology | Admitting: Radiation Oncology

## 2018-05-27 ENCOUNTER — Other Ambulatory Visit: Payer: Self-pay | Admitting: *Deleted

## 2018-05-27 ENCOUNTER — Other Ambulatory Visit: Payer: Self-pay

## 2018-05-27 ENCOUNTER — Encounter: Payer: Self-pay | Admitting: Radiation Oncology

## 2018-05-27 DIAGNOSIS — Z923 Personal history of irradiation: Secondary | ICD-10-CM | POA: Diagnosis not present

## 2018-05-27 DIAGNOSIS — C50412 Malignant neoplasm of upper-outer quadrant of left female breast: Secondary | ICD-10-CM | POA: Insufficient documentation

## 2018-05-27 DIAGNOSIS — Z79811 Long term (current) use of aromatase inhibitors: Secondary | ICD-10-CM | POA: Insufficient documentation

## 2018-05-27 DIAGNOSIS — Z170 Estrogen receptor positive status [ER+]: Secondary | ICD-10-CM | POA: Insufficient documentation

## 2018-05-27 MED ORDER — AZITHROMYCIN 250 MG PO TABS: ORAL_TABLET | ORAL | 0 refills | Status: DC

## 2018-05-27 NOTE — Progress Notes (Signed)
Radiation Oncology Follow up Note  Name: Norma Austin   Date:   05/27/2018 MRN:  [id number] DOB: 01-14-1961    This 57 y.o. female presents to the clinic today for 1 year follow-up status post accelerated partial breast radiation of her left breast for stage I ER/PR positive invasive mammary carcinoma.  REFERRING PROVIDER: Linus Salmons, PA*  HPI: patient is a 57 year old female now out 1 year having completed accelerated partial breast radiation to her left breast for stage I ER/PR positive invasive mammary carcinoma. She is seen today in routine follow-up and is doing well she specifically denies breast tenderness cough or bone pain..she's currently on Femara tolerate that well without side effect.she had mammograms back in June which I have reviewedshowing BI-RADS 2 benign disease..  COMPLICATIONS OF TREATMENT: none  FOLLOW UP COMPLIANCE: keeps appointments   PHYSICAL EXAM:  LMP 03/19/2011 (Approximate) Comment: LMP 2011 or 2012 Lungs are clear to A&P cardiac examination essentially unremarkable with regular rate and rhythm. No dominant mass or nodularity is noted in either breast in 2 positions examined. Incision is well-healed. No axillary or supraclavicular adenopathy is appreciated. Cosmetic result is excellent.Well-developed well-nourished patient in NAD. HEENT reveals PERLA, EOMI, discs not visualized.  Oral cavity is clear. No oral mucosal lesions are identified. Neck is clear without evidence of cervical or supraclavicular adenopathy. Lungs are clear to A&P. Cardiac examination is essentially unremarkable with regular rate and rhythm without murmur rub or thrill. Abdomen is benign with no organomegaly or masses noted. Motor sensory and DTR levels are equal and symmetric in the upper and lower extremities. Cranial nerves II through XII are grossly intact. Proprioception is intact. No peripheral adenopathy or edema is identified. No motor or sensory levels are noted. Crude visual  fields are within normal range.  RADIOLOGY RESULTS: mammograms are reviewed and compatible above-stated findings  PLAN: at the present time patient continues to do well with no evidence of disease. I'm please were overall progress. I've asked to see her back in 1 year for follow-up. She is a rescheduled for follow-up mammograms. She continues on Femara without side effect. Patient is to call with any concerns.  I would like to take this opportunity to thank you for allowing me to participate in the care of your patient.Noreene Filbert, MD

## 2018-06-21 ENCOUNTER — Other Ambulatory Visit: Payer: BLUE CROSS/BLUE SHIELD

## 2018-06-21 ENCOUNTER — Ambulatory Visit: Payer: BLUE CROSS/BLUE SHIELD | Admitting: Oncology

## 2018-06-27 ENCOUNTER — Encounter: Payer: Self-pay | Admitting: *Deleted

## 2018-06-28 ENCOUNTER — Inpatient Hospital Stay: Payer: BLUE CROSS/BLUE SHIELD

## 2018-06-28 ENCOUNTER — Inpatient Hospital Stay: Payer: BLUE CROSS/BLUE SHIELD | Attending: Oncology | Admitting: Oncology

## 2018-06-28 ENCOUNTER — Inpatient Hospital Stay: Payer: BLUE CROSS/BLUE SHIELD | Admitting: Oncology

## 2018-06-28 ENCOUNTER — Other Ambulatory Visit: Payer: Self-pay

## 2018-06-28 ENCOUNTER — Encounter: Payer: Self-pay | Admitting: Oncology

## 2018-06-28 VITALS — BP 133/84 | HR 70 | Temp 97.3°F | Resp 16 | Wt 243.2 lb

## 2018-06-28 DIAGNOSIS — C50412 Malignant neoplasm of upper-outer quadrant of left female breast: Secondary | ICD-10-CM | POA: Diagnosis present

## 2018-06-28 DIAGNOSIS — M8588 Other specified disorders of bone density and structure, other site: Secondary | ICD-10-CM | POA: Diagnosis not present

## 2018-06-28 DIAGNOSIS — Z79811 Long term (current) use of aromatase inhibitors: Secondary | ICD-10-CM

## 2018-06-28 DIAGNOSIS — C50912 Malignant neoplasm of unspecified site of left female breast: Secondary | ICD-10-CM

## 2018-06-28 DIAGNOSIS — N951 Menopausal and female climacteric states: Secondary | ICD-10-CM | POA: Diagnosis not present

## 2018-06-28 DIAGNOSIS — Z87891 Personal history of nicotine dependence: Secondary | ICD-10-CM | POA: Diagnosis not present

## 2018-06-28 DIAGNOSIS — Z170 Estrogen receptor positive status [ER+]: Secondary | ICD-10-CM | POA: Insufficient documentation

## 2018-06-28 LAB — CBC WITH DIFFERENTIAL/PLATELET
Abs Immature Granulocytes: 0.01 10*3/uL (ref 0.00–0.07)
BASOS ABS: 0.1 10*3/uL (ref 0.0–0.1)
BASOS PCT: 1 %
EOS ABS: 0.1 10*3/uL (ref 0.0–0.5)
Eosinophils Relative: 2 %
HCT: 40.7 % (ref 36.0–46.0)
Hemoglobin: 13.6 g/dL (ref 12.0–15.0)
IMMATURE GRANULOCYTES: 0 %
Lymphocytes Relative: 27 %
Lymphs Abs: 1.6 10*3/uL (ref 0.7–4.0)
MCH: 29.6 pg (ref 26.0–34.0)
MCHC: 33.4 g/dL (ref 30.0–36.0)
MCV: 88.7 fL (ref 80.0–100.0)
Monocytes Absolute: 0.4 10*3/uL (ref 0.1–1.0)
Monocytes Relative: 7 %
NEUTROS PCT: 63 %
NRBC: 0 % (ref 0.0–0.2)
Neutro Abs: 3.7 10*3/uL (ref 1.7–7.7)
PLATELETS: 199 10*3/uL (ref 150–400)
RBC: 4.59 MIL/uL (ref 3.87–5.11)
RDW: 12.6 % (ref 11.5–15.5)
WBC: 5.8 10*3/uL (ref 4.0–10.5)

## 2018-06-28 LAB — COMPREHENSIVE METABOLIC PANEL
ALT: 24 U/L (ref 0–44)
ANION GAP: 7 (ref 5–15)
AST: 18 U/L (ref 15–41)
Albumin: 4 g/dL (ref 3.5–5.0)
Alkaline Phosphatase: 64 U/L (ref 38–126)
BUN: 13 mg/dL (ref 6–20)
CALCIUM: 9 mg/dL (ref 8.9–10.3)
CHLORIDE: 106 mmol/L (ref 98–111)
CO2: 27 mmol/L (ref 22–32)
Creatinine, Ser: 0.77 mg/dL (ref 0.44–1.00)
GFR calc non Af Amer: >60 mL/min (ref >60)
Glucose, Bld: 103 mg/dL — ABNORMAL HIGH (ref 70–99)
Potassium: 4.1 mmol/L (ref 3.5–5.1)
SODIUM: 140 mmol/L (ref 135–145)
Total Bilirubin: 0.5 mg/dL (ref 0.3–1.2)
Total Protein: 7.3 g/dL (ref 6.5–8.1)

## 2018-06-28 MED ORDER — LETROZOLE 2.5 MG PO TABS: 2.5000 mg | ORAL_TABLET | Freq: Every day | ORAL | 1 refills | Status: DC

## 2018-06-28 NOTE — Progress Notes (Signed)
Patient here today for follow up.  Patient states no new concerns today  

## 2018-06-29 NOTE — Progress Notes (Signed)
[place] [facility] Consultation Note  Patient Care Team: Sifferman, Grafton Folk as PCP - General (Physician Assistant) [name], MD as Referring Physician (Family Medicine) [name], MD (General Surgery)  REASON FOR VISIT Follow up for treatment of Stage ER PR breast cancer  HISTORY OF PRESENTING ILLNESS: 1 Norma Austin 57 y.o. female with PMH listed as below is referred by Dr.Cook to cancer center for evaluation and management of newly diagnosed breast caner.  Patient had routine mammogram screening which showed left breast mass. This was followed by a diagnostic Mammogram and ultrasound which showed an hypoechoic mass measuring 12 x 7 x 10 mm at 1:30, 7 cm from the nipple, corresponding to thespiculated mass seen mammographically. There is an adjacent near anechoic mass at 1 o'clock, 6 cm from the nipple measuring 6 x 4 x 4 mm. 1:30 mass was biopsy and showed invasive carcinoma of breast, unspecified type, no LVI, No DCIS. The 1 o'clock mass disappeared after biopsy of the 1:30 mass. It maybe a cyst which was poked by the needle for anesthesia.   2 Pathology: 02/24/2017 US guided breast mass biopsy showed invasive mammary carcinoma, grade 1, ER 90% PR90%, HER2 Equivocal by IHC, negative by FISH. S/p left breast lumpectomy and sentinel lymph node biopsy on 03/18/2017. Pathology showed 40m invasive mammary carcinoma of no specific type, margins are all negative, and all 3 sentinel lymph nodes were negative. ER/PR 90%, HER2 FISH negative. Mammaprint showed low risk.  3  lumpectomy followed by MammoSite radiation. Mammaprint indicates low risk. Adjuvant chemotherapy is not offered.   Interval History:  Patient with above oncology history reviewed by me present for follow-up management of stage IA ER/PR positive HER 2 negative breast cancer.  Patient takes letrozole daily hot flashes manageable.  No new complaints.  Reports doing well.   Review of Systems   Constitutional: Negative for chills, fever, malaise/fatigue and weight loss.  HENT: Negative for congestion, ear discharge, ear pain, nosebleeds, sinus pain and sore throat.   Eyes: Negative for double vision, photophobia, pain, discharge and redness.  Respiratory: Negative for cough, hemoptysis, sputum production, shortness of breath and wheezing.   Cardiovascular: Negative for chest pain, palpitations, orthopnea, claudication and leg swelling.  Gastrointestinal: Negative for abdominal pain, blood in stool, constipation, diarrhea, heartburn, melena, nausea and vomiting.  Genitourinary: Negative for dysuria, flank pain, frequency and hematuria.  Musculoskeletal: Negative for joint pain, myalgias and neck pain.  Skin: Negative for itching and rash.  Neurological: Negative for dizziness, tingling, tremors, focal weakness, weakness and headaches.  Endo/Heme/Allergies: Negative for environmental allergies. Does not bruise/bleed easily.       Hot flush  Psychiatric/Behavioral: Negative for depression and hallucinations. The patient is not nervous/anxious.     MEDICAL HISTORY: Past Medical History:  Diagnosis Date  . Arthritis   . [place]   INVASIVE MAMMARY CARCINOMA, T1c, N0, 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  . GERD (gastroesophageal reflux disease)   . Indigestion   . Personal history of radiation therapy 2018   F/U left breast cancer  . Plantar fasciitis     SURGICAL HISTORY: Past Surgical History:  Procedure Laterality Date  . BREAST BIOPSY Left 02/24/2017   INVASIVE MAMMARY CARCINOMA  . BREAST LUMPECTOMY Left 2018   Invasive, F/U with radiation and Letrozole   . BREAST LUMPECTOMY WITH SENTINEL LYMPH NODE BIOPSY Left 03/18/2017   Procedure: BREAST LUMPECTOMY WITH SENTINEL LYMPH NODE BX;  Surgeon: SChristene Lye  MD;  Location: [facility] ORS;  Service: General;  Laterality: Left;  . BREAST MAMMOSITE Left 03/2017  . GANGLION CYST EXCISION  1990  . MOUTH  SURGERY     4 BOTTOM FRONT DENTAL IMPLANTS    SOCIAL HISTORY: Social History   Socioeconomic History  . Marital status: Married    Spouse name: Not on file  . Number of children: Not on file  . Years of education: Not on file  . Highest education level: Not on file  Occupational History  . Not on file  Social Needs  . Financial resource strain: Not on file  . Food insecurity:    Worry: Not on file    Inability: Not on file  . Transportation needs:    Medical: Not on file    Non-medical: Not on file  Tobacco Use  . Smoking status: Former Smoker    Packs/day: 1.00    Years: 25.00    Pack years: 25.00    Types: Cigarettes    Last attempt to quit: 03/12/2007    Years since quitting: 11.3  . Smokeless tobacco: Never Used  Substance and Sexual Activity  . Alcohol use: Yes    Alcohol/week: 0.0 standard drinks    Comment: occasional  . Drug use: No  . Sexual activity: Not on file  Lifestyle  . Physical activity:    Days per week: Not on file    Minutes per session: Not on file  . Stress: Not on file  Relationships  . Social connections:    Talks on phone: Not on file    Gets together: Not on file    Attends religious service: Not on file    Active member of club or organization: Not on file    Attends meetings of clubs or organizations: Not on file    Relationship status: Not on file  . Intimate partner violence:    Fear of current or ex partner: Not on file    Emotionally abused: Not on file    Physically abused: Not on file    Forced sexual activity: Not on file  Other Topics Concern  . Not on file  Social History Narrative  . Not on file    FAMILY HISTORY Family History  Problem Relation Age of Onset  . Breast cancer Mother 58  . Lung cancer Father   . Thyroid cancer Cousin   . Colon cancer Neg Hx     ALLERGIES:  is allergic to adhesive [tape].  MEDICATIONS:  Current Outpatient Medications  Medication Sig Dispense Refill  . acetaminophen (TYLENOL)  500 MG tablet Take 1,000 mg by mouth every 6 (six) hours as needed (for pain.).    Marland Kitchen diclofenac (VOLTAREN) 50 MG EC tablet Take 50 mg by mouth 2 (two) times daily as needed (for plantar fasciitis/arthritis.).     Marland Kitchen letrozole (FEMARA) 2.5 MG tablet Take 1 tablet (2.5 mg total) by mouth daily. 90 tablet 1  . loratadine (CLARITIN) 10 MG tablet Take 10 mg by mouth daily.    . ranitidine (ZANTAC) 150 MG tablet Take 150 mg by mouth as needed for heartburn.     No current facility-administered medications for this visit.     PHYSICAL EXAMINATION:  ECOG PERFORMANCE STATUS: 0 - Asymptomatic   Vitals:   06/28/18 1324  BP: 133/84  Pulse: 70  Resp: 16  Temp: (!) 97.3 F (36.3 C)    Filed Weights   06/28/18 1324  Weight: 243 lb 3 oz (110.3  kg)     Physical Exam  Constitutional: She is oriented to person, place, and time. No distress.  HENT:  Head: Normocephalic and atraumatic.  Nose: Nose normal.  Mouth/Throat: Oropharynx is clear and moist. No oropharyngeal exudate.  Eyes: Pupils are equal, round, and reactive to light. EOM are normal. Left eye exhibits no discharge. No scleral icterus.  Neck: Normal range of motion. Neck supple. No JVD present.  Cardiovascular: Normal rate, regular rhythm and normal heart sounds.  No murmur heard. Pulmonary/Chest: Effort normal and breath sounds normal. No respiratory distress. She has no wheezes. She has no rales. She exhibits no tenderness.  Abdominal: Soft. She exhibits no distension and no mass. There is no tenderness. There is no rebound.  Musculoskeletal: Normal range of motion. She exhibits no edema or tenderness.  Lymphadenopathy:    She has no cervical adenopathy.  Neurological: She is alert and oriented to person, place, and time. No cranial nerve deficit. She exhibits normal muscle tone. Coordination normal.  Skin: Skin is warm and dry. No rash noted. She is not diaphoretic. No erythema.  Psychiatric: Affect and judgment normal.  Breast  exam was performed in seated and lying down position. Patient is status post lumpectomy of left breast  with a well-healed surgical scar. No evidence of any palpable masses bilateral . No evidence of bilateral axillary adenopathy.    LABORATORY DATA: I have personally reviewed the data as listed: Pathology 7/18  Surgical Pathology   THIS IS AN ADDENDUM REPORT  CASE: [id number]  PATIENT: [facility]  Surgical Pathology Report  Reason for Addendum #1: Immunohistochemistry results   RADIOGRAPHIC STUDIES: I have personally reviewed the radiological images as listed and agree with the findings in the report 01/28/2017 Mammogram screening FINDINGS: In the left breast, a possible mass warrants further evaluation. In the right breast, no findings suspicious for malignancy. IMPRESSION: Further evaluation is suggested for possible mass in the left breast. 02/08/2017 Mammogram diagnostic left IMPRESSION: 1. Highly suspicious left breast mass at 1:30, 7 cm from the nipple. 2. Indeterminate mass at 1 o'clock, 6 cm from the nipple.  02/08/2017 2D DIGITAL DIAGNOSTIC LEFT MAMMOGRAM WITH CAD AND ADJUNCT TOMO ULTRASOUND LEFT BREAST COMPARISON:  Previous exam(s). ACR Breast Density Category b: There are scattered areas of fibroglandular density. FINDINGS: Additional views of the left breast demonstrates a persistent spiculated mass in the upper, outer left breast, corresponding to the mass seen on screening mammogram. There is a smaller, adjacent oval mass within the slightly more medial upper, outer left breast. Mammographic images were processed with CAD. On physical exam, there is palpable thickening at 1:30, 7 cm from the nipple in the area of concern. Ultrasound demonstrates an irregular, hypoechoic mass measuring 12 x 7 x 10 mm at 1:30, 7 cm from the nipple, corresponding to the spiculated mass seen mammographically. There is an adjacent near anechoic mass at 1 o'clock, 6 cm from the  nipple measuring 6 x 4 x 4 mm, corresponding to the additional mass seen mammographically. Ultrasound of the left axilla demonstrates no suspicious appearing axillary lymph nodes. IMPRESSION: 1. Highly suspicious left breast mass at 1:30, 7 cm from the nipple. 2. Indeterminate mass at 1 o'clock, 6 cm from the nipple. EXAM: ULTRASOUND GUIDED LEFT BREAST CORE NEEDLE BIOPSY COMPARISON:  Previous exam(s). FINDINGS: I met with the patient and we discussed the procedure of ultrasound-guided biopsy, including benefits and alternatives. We discussed the high likelihood of a successful procedure. We discussed the risks of the procedure, including  infection, bleeding, tissue injury, clip migration, and inadequate sampling. Informed written consent was given. The usual time-out protocol was performed immediately prior to the procedure.  Lesion quadrant: Upper outer quadrant  Using sterile technique and 1% Lidocaine as local anesthetic, under direct ultrasound visualization, a 12 gauge spring-loaded device was used to perform biopsy of the highly suspicious mass in the 1:30 o'clock position using an inferior approach. At the conclusion of the procedure a coil shaped tissue marker clip was deployed into the biopsy cavity. Follow up 2 view mammogram was performed and dictated separately.  Following the local anesthesia, the indeterminate mass, that was localized at the 1 o'clock position, could no longer be visualized. This appears to have been disrupted with the local anesthesia needle or possibly obscured by the infiltrated lidocaine. This strongly supports this lesion being benign cyst or dilated duct. IMPRESSION: Ultrasound guided biopsy of a left breast mass. No apparent Complications.  ASSESSMENT/PLAN Cancer Staging Malignant neoplasm of upper-outer quadrant of left breast in female, estrogen receptor positive ([place]) Staging form: Breast, AJCC 8th Edition - Clinical stage from  03/03/2017: Stage IA (cT1c, cN0, cM0, G1, ER: Positive, PR: Positive, HER2: Equivocal) - Signed by Earlie Server, MD on 03/03/2017 - Pathologic stage from 04/30/2017: pT1c, pN0, cM0, ER: Positive, PR: Positive, HER2: Negative - Signed by Earlie Server, MD on 04/30/2017  1. Malignant neoplasm of upper-outer quadrant of left breast in female, estrogen receptor positive ([place])   2. Osteopenia of spine   3. Use of letrozole (Femara)   4. Er+ PR+ carcinoma of breast, left (HCC)   #Breast cancer,  Continue letrozole, tolerates well, with manageable hot flush.   # Osteopenia, Advise patient to take calcium and vitamin D supplements  discussed with patient about bisphosphonate for treatment of osteopenia. She has not obtained dental clearance.  # Advise annual mammogram bilaterally.  02/01/2018 Mammogram was independently reviewed by me and discussed with patient.  No mammographic evidence of malignancy.  She is due for next mammogram in June 2019. Patient usually gets scheduled though Dr.Byrnett's office.   Follow up in 6 months.  All questions were answered. The patient knows to call the clinic with any problems, questions or concerns.   Earlie Server MD PhD [facility] [phone] Phone: [phone] 06/29/18

## 2018-12-26 ENCOUNTER — Inpatient Hospital Stay (HOSPITAL_BASED_OUTPATIENT_CLINIC_OR_DEPARTMENT_OTHER): Payer: BLUE CROSS/BLUE SHIELD | Admitting: Oncology

## 2018-12-26 ENCOUNTER — Other Ambulatory Visit: Payer: Self-pay

## 2018-12-26 ENCOUNTER — Inpatient Hospital Stay: Payer: BLUE CROSS/BLUE SHIELD | Attending: Oncology

## 2018-12-26 ENCOUNTER — Encounter: Payer: Self-pay | Admitting: Oncology

## 2018-12-26 ENCOUNTER — Inpatient Hospital Stay: Payer: BLUE CROSS/BLUE SHIELD | Admitting: Oncology

## 2018-12-26 DIAGNOSIS — Z79811 Long term (current) use of aromatase inhibitors: Secondary | ICD-10-CM

## 2018-12-26 DIAGNOSIS — Z79899 Other long term (current) drug therapy: Secondary | ICD-10-CM

## 2018-12-26 DIAGNOSIS — Z170 Estrogen receptor positive status [ER+]: Secondary | ICD-10-CM | POA: Diagnosis not present

## 2018-12-26 DIAGNOSIS — Z87891 Personal history of nicotine dependence: Secondary | ICD-10-CM

## 2018-12-26 DIAGNOSIS — M8588 Other specified disorders of bone density and structure, other site: Secondary | ICD-10-CM | POA: Insufficient documentation

## 2018-12-26 DIAGNOSIS — C50412 Malignant neoplasm of upper-outer quadrant of left female breast: Secondary | ICD-10-CM

## 2018-12-26 DIAGNOSIS — M8580 Other specified disorders of bone density and structure, unspecified site: Secondary | ICD-10-CM

## 2018-12-26 HISTORY — DX: Other specified disorders of bone density and structure, unspecified site: M85.80

## 2018-12-26 LAB — CBC WITH DIFFERENTIAL/PLATELET
Abs Immature Granulocytes: 0.02 10*3/uL (ref 0.00–0.07)
Basophils Absolute: 0 10*3/uL (ref 0.0–0.1)
Basophils Relative: 1 %
Eosinophils Absolute: 0.1 10*3/uL (ref 0.0–0.5)
Eosinophils Relative: 2 %
HCT: 39.5 % (ref 36.0–46.0)
Hemoglobin: 13.7 g/dL (ref 12.0–15.0)
Immature Granulocytes: 0 %
Lymphocytes Relative: 24 %
Lymphs Abs: 1.4 10*3/uL (ref 0.7–4.0)
MCH: 30.4 pg (ref 26.0–34.0)
MCHC: 34.7 g/dL (ref 30.0–36.0)
MCV: 87.8 fL (ref 80.0–100.0)
Monocytes Absolute: 0.4 10*3/uL (ref 0.1–1.0)
Monocytes Relative: 6 %
Neutro Abs: 3.9 10*3/uL (ref 1.7–7.7)
Neutrophils Relative %: 67 %
Platelets: 197 10*3/uL (ref 150–400)
RBC: 4.5 MIL/uL (ref 3.87–5.11)
RDW: 12.8 % (ref 11.5–15.5)
WBC: 5.9 10*3/uL (ref 4.0–10.5)
nRBC: 0 % (ref 0.0–0.2)

## 2018-12-26 LAB — COMPREHENSIVE METABOLIC PANEL
ALT: 26 U/L (ref 0–44)
AST: 20 U/L (ref 15–41)
Albumin: 4 g/dL (ref 3.5–5.0)
Alkaline Phosphatase: 60 U/L (ref 38–126)
Anion gap: 7 (ref 5–15)
BUN: 12 mg/dL (ref 6–20)
CO2: 25 mmol/L (ref 22–32)
Calcium: 9.1 mg/dL (ref 8.9–10.3)
Chloride: 108 mmol/L (ref 98–111)
Creatinine, Ser: 0.78 mg/dL (ref 0.44–1.00)
GFR calc Af Amer: >60 mL/min (ref >60)
GFR calc non Af Amer: >60 mL/min (ref >60)
Glucose, Bld: 111 mg/dL — ABNORMAL HIGH (ref 70–99)
Potassium: 4.1 mmol/L (ref 3.5–5.1)
Sodium: 140 mmol/L (ref 135–145)
Total Bilirubin: 0.6 mg/dL (ref 0.3–1.2)
Total Protein: 7.6 g/dL (ref 6.5–8.1)

## 2018-12-26 MED ORDER — LETROZOLE 2.5 MG PO TABS: 2.5000 mg | ORAL_TABLET | Freq: Every day | ORAL | 1 refills | Status: DC

## 2018-12-26 NOTE — Progress Notes (Signed)
HEMATOLOGY-ONCOLOGY TeleHEALTH VISIT PROGRESS NOTE  I connected with [place] on 12/26/18 at 11:15 AM EDT by video enabled telemedicine visit and verified that I am speaking with the correct person using two identifiers. I discussed the limitations, risks, security and privacy concerns of performing an evaluation and management service by telemedicine and the availability of in-person appointments. I also discussed with the patient that there may be a patient responsible charge related to this service. The patient expressed understanding and agreed to proceed.   Other persons participating in the visit and their role in the encounter:  Geraldine Solar, [place], check in patient   Janeann Merl, RN, check in patient.   Patient's location: Home  Provider's location: Home office Chief Complaint: Follow up for breast cancer   INTERVAL HISTORY Norma Austin is a 58 y.o. female who has above history reviewed by me today presents for follow up visit for management of stage IA ER/PR positive HER 2 negative breast cancer Problems and complaints are listed below:  She reports doing well at baseline. She takes letrozole 2.14m daily.  Tolerating well. manageable hot flash.  No new concerns . She does not have any concerns with her breasts.   Review of Systems  Constitutional: Negative for appetite change, chills, fatigue and fever.  HENT:   Negative for hearing loss and voice change.   Eyes: Negative for eye problems.  Respiratory: Negative for chest tightness and cough.   Cardiovascular: Negative for chest pain.  Gastrointestinal: Negative for abdominal distention, abdominal pain and blood in stool.  Endocrine: Negative for hot flashes.  Genitourinary: Negative for difficulty urinating and frequency.   Musculoskeletal: Negative for arthralgias.  Skin: Negative for itching and rash.  Neurological: Negative for extremity weakness.  Hematological: Negative for adenopathy.   Psychiatric/Behavioral: Negative for confusion.    Past Medical History:  Diagnosis Date  . Arthritis   . [place]   INVASIVE MAMMARY CARCINOMA, T1c, N0, 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  . GERD (gastroesophageal reflux disease)   . Indigestion   . Osteopenia 12/26/2018  . Personal history of radiation therapy 2018   F/U left breast cancer  . Plantar fasciitis    Past Surgical History:  Procedure Laterality Date  . BREAST BIOPSY Left 02/24/2017   INVASIVE MAMMARY CARCINOMA  . BREAST LUMPECTOMY Left 2018   Invasive, F/U with radiation and Letrozole   . BREAST LUMPECTOMY WITH SENTINEL LYMPH NODE BIOPSY Left 03/18/2017   Procedure: BREAST LUMPECTOMY WITH SENTINEL LYMPH NODE BX;  Surgeon: SChristene Lye MD;  Location: [facility] ORS;  Service: General;  Laterality: Left;  . BREAST MAMMOSITE Left 03/2017  . GANGLION CYST EXCISION  1990  . MOUTH SURGERY     4 BOTTOM FRONT DENTAL IMPLANTS    Family History  Problem Relation Age of Onset  . Breast cancer Mother 873 . Lung cancer Father   . Thyroid cancer Cousin   . Colon cancer Neg Hx     Social History   Socioeconomic History  . Marital status: Married    Spouse name: Not on file  . Number of children: Not on file  . Years of education: Not on file  . Highest education level: Not on file  Occupational History  . Not on file  Social Needs  . Financial resource strain: Not on file  . Food insecurity:    Worry: Not on file    Inability: Not on file  . Transportation needs:  Medical: Not on file    Non-medical: Not on file  Tobacco Use  . Smoking status: Former Smoker    Packs/day: 1.00    Years: 25.00    Pack years: 25.00    Types: Cigarettes    Last attempt to quit: 03/12/2007    Years since quitting: 11.8  . Smokeless tobacco: Never Used  Substance and Sexual Activity  . Alcohol use: Yes    Alcohol/week: 0.0 standard drinks    Comment: occasional  . Drug use: No  . Sexual activity: Not  on file  Lifestyle  . Physical activity:    Days per week: Not on file    Minutes per session: Not on file  . Stress: Not on file  Relationships  . Social connections:    Talks on phone: Not on file    Gets together: Not on file    Attends religious service: Not on file    Active member of club or organization: Not on file    Attends meetings of clubs or organizations: Not on file    Relationship status: Not on file  . Intimate partner violence:    Fear of current or ex partner: Not on file    Emotionally abused: Not on file    Physically abused: Not on file    Forced sexual activity: Not on file  Other Topics Concern  . Not on file  Social History Narrative  . Not on file    Current Outpatient Medications on File Prior to Visit  Medication Sig Dispense Refill  . acetaminophen (TYLENOL) 500 MG tablet Take 1,000 mg by mouth every 6 (six) hours as needed (for pain.).    Marland Kitchen diclofenac (VOLTAREN) 50 MG EC tablet Take 50 mg by mouth 2 (two) times daily as needed (for plantar fasciitis/arthritis.).     Marland Kitchen loratadine (CLARITIN) 10 MG tablet Take 10 mg by mouth daily.    . rosuvastatin (CRESTOR) 10 MG tablet Take 10 mg by mouth daily.     No current facility-administered medications on file prior to visit.     Allergies  Allergen Reactions  . Adhesive [Tape]     WHELPS       Observations/Objective: There were no vitals filed for this visit. There is no height or weight on file to calculate BMI.  Pain 0 Physical Exam  Constitutional: She is oriented to person, place, and time. No distress.  HENT:  Head: Normocephalic and atraumatic.  Pulmonary/Chest: Effort normal. No respiratory distress.  Neurological: She is alert and oriented to person, place, and time.  Psychiatric: Affect normal.    CBC    Component Value Date/Time   WBC 5.9 12/26/2018 1020   RBC 4.50 12/26/2018 1020   HGB 13.7 12/26/2018 1020   HCT 39.5 12/26/2018 1020   PLT 197 12/26/2018 1020   MCV 87.8  12/26/2018 1020   MCH 30.4 12/26/2018 1020   MCHC 34.7 12/26/2018 1020   RDW 12.8 12/26/2018 1020   LYMPHSABS 1.4 12/26/2018 1020   MONOABS 0.4 12/26/2018 1020   EOSABS 0.1 12/26/2018 1020   BASOSABS 0.0 12/26/2018 1020    CMP     Component Value Date/Time   NA 140 12/26/2018 1020   K 4.1 12/26/2018 1020   CL 108 12/26/2018 1020   CO2 25 12/26/2018 1020   GLUCOSE 111 (H) 12/26/2018 1020   BUN 12 12/26/2018 1020   CREATININE 0.78 12/26/2018 1020   CALCIUM 9.1 12/26/2018 1020   PROT 7.6 12/26/2018 1020  ALBUMIN 4.0 12/26/2018 1020   AST 20 12/26/2018 1020   ALT 26 12/26/2018 1020   ALKPHOS 60 12/26/2018 1020   BILITOT 0.6 12/26/2018 1020   GFRNONAA >60 12/26/2018 1020   GFRAA >60 12/26/2018 1020     Assessment and Plan: 1. Malignant neoplasm of upper-outer quadrant of left breast in female, estrogen receptor positive ([place])   2. Osteopenia, unspecified location   3. Use of letrozole ([place])     Stage IA ER/PR positive breast cancer,  Continue letrozole 2.25m daily. Tolerates well.  She is due for bilateral annual diagnostic mammogram. She usually gets through Dr.Byrnett's office. She will call his office.   Osteopenia, in the context of aromatase inhibitor use.  She is supposed to get dental clearance, however, due to COVID 19 pandemic, she is not able to obtain. She has discussed with her Periodontist who supports her decision of bisphosphonate treatments.  She would like to proceed bisphosphonate treatment without dental clearnace as she is concerned about her pathological fracture. She is aware of potential risk of osteonecrosis.  Recommend Zometa 478mevery 6 months.  Recommend continue calcium and vitamin D level.    Follow Up Instructions: Follow up in 3 months.   I discussed the assessment and treatment plan with the patient. The patient was provided an opportunity to ask questions and all were answered. The patient agreed with the plan and demonstrated an  understanding of the instructions.  The patient was advised to call back or seek an in-person evaluation if the symptoms worsen or if the condition fails to improve as anticipated.    ZhEarlie ServerMD 12/26/2018 5:07 PM

## 2018-12-26 NOTE — Progress Notes (Signed)
Called patient for Telehealth visit via [employer].  Patient states no new concerns today.  

## 2018-12-29 ENCOUNTER — Other Ambulatory Visit: Payer: Self-pay

## 2018-12-29 ENCOUNTER — Inpatient Hospital Stay: Payer: BLUE CROSS/BLUE SHIELD

## 2018-12-29 VITALS — BP 129/81 | HR 68 | Temp 98.4°F | Resp 18

## 2018-12-29 DIAGNOSIS — M8580 Other specified disorders of bone density and structure, unspecified site: Secondary | ICD-10-CM

## 2018-12-29 DIAGNOSIS — C50412 Malignant neoplasm of upper-outer quadrant of left female breast: Secondary | ICD-10-CM | POA: Diagnosis not present

## 2018-12-29 MED ORDER — ZOLEDRONIC ACID 4 MG/100ML IV SOLN
4.0000 mg | Freq: Once | INTRAVENOUS | Status: AC
  Administered 2018-12-29: 4 mg via INTRAVENOUS
  Filled 2018-12-29: qty 100

## 2018-12-29 MED ORDER — SODIUM CHLORIDE 0.9 % IV SOLN
Freq: Once | INTRAVENOUS | Status: AC
  Administered 2018-12-29: 14:00:00 via INTRAVENOUS
  Filled 2018-12-29: qty 250

## 2019-02-06 ENCOUNTER — Other Ambulatory Visit: Payer: Self-pay | Admitting: *Deleted

## 2019-02-06 DIAGNOSIS — C50412 Malignant neoplasm of upper-outer quadrant of left female breast: Secondary | ICD-10-CM

## 2019-02-27 ENCOUNTER — Telehealth: Payer: Self-pay | Admitting: *Deleted

## 2019-02-27 ENCOUNTER — Ambulatory Visit
Admission: RE | Admit: 2019-02-27 | Discharge: 2019-02-27 | Disposition: A | Discharge status: Home / Self Care | Payer: BC Managed Care – PPO | Source: Ambulatory Visit | Attending: General Surgery | Admitting: General Surgery

## 2019-02-27 DIAGNOSIS — C50412 Malignant neoplasm of upper-outer quadrant of left female breast: Secondary | ICD-10-CM

## 2019-02-27 DIAGNOSIS — Z170 Estrogen receptor positive status [ER+]: Secondary | ICD-10-CM | POA: Insufficient documentation

## 2019-02-27 NOTE — Telephone Encounter (Signed)
Left message for patient to call us back.  

## 2019-03-01 NOTE — Telephone Encounter (Signed)
Mammogram was normal . Left message . Patient to call back and scheduled an appt Dr. Dahlia Byes

## 2019-03-07 ENCOUNTER — Ambulatory Visit: Payer: BLUE CROSS/BLUE SHIELD | Admitting: General Surgery

## 2019-03-15 ENCOUNTER — Ambulatory Visit: Payer: BLUE CROSS/BLUE SHIELD | Admitting: Surgery

## 2019-03-15 ENCOUNTER — Encounter: Payer: Self-pay | Admitting: Surgery

## 2019-03-15 ENCOUNTER — Other Ambulatory Visit: Payer: Self-pay

## 2019-03-15 VITALS — BP 134/85 | HR 85 | Temp 97.5°F | Ht 67.0 in | Wt 246.0 lb

## 2019-03-15 DIAGNOSIS — C50412 Malignant neoplasm of upper-outer quadrant of left female breast: Secondary | ICD-10-CM | POA: Diagnosis not present

## 2019-03-15 DIAGNOSIS — Z170 Estrogen receptor positive status [ER+]: Secondary | ICD-10-CM

## 2019-03-15 NOTE — Progress Notes (Signed)
Outpatient Surgical Follow Up  03/15/2019  Norma Austin is an 58 y.o. female.   Chief Complaint  Patient presents with  . Follow-up    1 yr f/u recall bil diag mammo [facility] 02/27/19, C50.412+    HPI: 58 year old female status post lumpectomy and sentinel node biopsy by Dr. Jamal Collin on August 2018.  ER PR positive.  She has been on aromatase inhibitors since then.  She has also been seen by Dr. Tasia Catchings from oncology.  No new issues.  She had a recent mammogram that I have personally reviewed showing no evidence of recurrence.  Past Medical History:  Diagnosis Date  . Arthritis   . Breast cancer ([place]) 2018   INVASIVE MAMMARY CARCINOMA, T1c, N0, 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  . GERD (gastroesophageal reflux disease)   . Indigestion   . Osteopenia 12/26/2018  . Personal history of radiation therapy 2018   F/U left breast cancer  . Plantar fasciitis     Past Surgical History:  Procedure Laterality Date  . BREAST BIOPSY Left 02/24/2017   INVASIVE MAMMARY CARCINOMA  . BREAST LUMPECTOMY Left 2018   Invasive, F/U with radiation and Letrozole   . BREAST LUMPECTOMY WITH SENTINEL LYMPH NODE BIOPSY Left 03/18/2017   Procedure: BREAST LUMPECTOMY WITH SENTINEL LYMPH NODE BX;  Surgeon: [name], MD;  Location: [facility] ORS;  Service: General;  Laterality: Left;  . BREAST MAMMOSITE Left 03/2017  . GANGLION CYST EXCISION  1990  . MOUTH SURGERY     4 BOTTOM FRONT DENTAL IMPLANTS    Family History  Problem Relation Age of Onset  . Breast cancer Mother 82  . CVA Mother   . Lung cancer Father   . Thyroid cancer Cousin   . Colon cancer Neg Hx     Social History:  reports that she quit smoking about 12 years ago. Her smoking use included cigarettes. She has a 25.00 pack-year smoking history. She has never used smokeless tobacco. She reports current alcohol use. She reports that she does not use drugs.  Allergies:  Allergies  Allergen Reactions  . Adhesive [Tape]     WHELPS     Medications reviewed.    ROS Full ROS performed and is otherwise negative other than what is stated in HPI   BP 134/85   Pulse 85   Temp (!) 97.5 F (36.4 C) (Temporal)   Ht '5\' 7"'$  (1.702 m)   Wt 246 lb (111.6 kg)   LMP 03/19/2011 (Approximate) Comment: LMP 2011 or 2012  SpO2 96%   BMI 38.53 kg/m   Physical Exam NAD Neck: no LAd, no JVD, supple BREAST: Left lumpectomy and ax scar. No infection, no masses.. No LAD Abd: soft, nt   Assessment/Plan: 58 year old female with history of breast cancer on the left side now with a recent mammogram showing no evidence of malignancy and physical exam also benign.  We will see her back in 1 year with a repeat mammogram.   Greater than 50% of the 25 minutes  visit was spent in counseling/coordination of care   Caroleen Hamman, MD [place] Surgeon

## 2019-03-15 NOTE — Patient Instructions (Addendum)
The patient is aware to call back for any questions or new concerns. The patient has been asked to return to the office in one year with a bilateral diagnostic mammogram.

## 2019-03-27 ENCOUNTER — Inpatient Hospital Stay: Payer: BC Managed Care – PPO

## 2019-03-27 ENCOUNTER — Inpatient Hospital Stay: Payer: BC Managed Care – PPO | Admitting: Oncology

## 2019-03-28 ENCOUNTER — Other Ambulatory Visit: Payer: BLUE CROSS/BLUE SHIELD

## 2019-04-07 ENCOUNTER — Other Ambulatory Visit: Payer: Self-pay

## 2019-04-07 ENCOUNTER — Encounter: Payer: Self-pay | Admitting: Oncology

## 2019-04-07 NOTE — Progress Notes (Signed)
Patient reports an increase in joint pain and aches.

## 2019-04-10 ENCOUNTER — Inpatient Hospital Stay: Payer: BC Managed Care – PPO | Attending: Oncology | Admitting: Oncology

## 2019-04-10 ENCOUNTER — Other Ambulatory Visit: Payer: Self-pay

## 2019-04-10 ENCOUNTER — Inpatient Hospital Stay: Payer: BC Managed Care – PPO

## 2019-04-10 VITALS — BP 132/84 | HR 61 | Temp 98.5°F | Resp 16 | Wt 246.4 lb

## 2019-04-10 DIAGNOSIS — N951 Menopausal and female climacteric states: Secondary | ICD-10-CM | POA: Diagnosis not present

## 2019-04-10 DIAGNOSIS — E559 Vitamin D deficiency, unspecified: Secondary | ICD-10-CM | POA: Insufficient documentation

## 2019-04-10 DIAGNOSIS — C50412 Malignant neoplasm of upper-outer quadrant of left female breast: Secondary | ICD-10-CM | POA: Diagnosis not present

## 2019-04-10 DIAGNOSIS — Z87891 Personal history of nicotine dependence: Secondary | ICD-10-CM | POA: Diagnosis not present

## 2019-04-10 DIAGNOSIS — M1990 Unspecified osteoarthritis, unspecified site: Secondary | ICD-10-CM | POA: Diagnosis not present

## 2019-04-10 DIAGNOSIS — Z79811 Long term (current) use of aromatase inhibitors: Secondary | ICD-10-CM

## 2019-04-10 DIAGNOSIS — M8580 Other specified disorders of bone density and structure, unspecified site: Secondary | ICD-10-CM | POA: Diagnosis not present

## 2019-04-10 DIAGNOSIS — Z170 Estrogen receptor positive status [ER+]: Secondary | ICD-10-CM | POA: Diagnosis not present

## 2019-04-10 LAB — CBC WITH DIFFERENTIAL/PLATELET
Abs Immature Granulocytes: 0.01 10*3/uL (ref 0.00–0.07)
Basophils Absolute: 0.1 10*3/uL (ref 0.0–0.1)
Basophils Relative: 1 %
Eosinophils Absolute: 0.2 10*3/uL (ref 0.0–0.5)
Eosinophils Relative: 3 %
HCT: 41 % (ref 36.0–46.0)
Hemoglobin: 13.9 g/dL (ref 12.0–15.0)
Immature Granulocytes: 0 %
Lymphocytes Relative: 23 %
Lymphs Abs: 1.6 10*3/uL (ref 0.7–4.0)
MCH: 29.5 pg (ref 26.0–34.0)
MCHC: 33.9 g/dL (ref 30.0–36.0)
MCV: 87 fL (ref 80.0–100.0)
Monocytes Absolute: 0.5 10*3/uL (ref 0.1–1.0)
Monocytes Relative: 7 %
Neutro Abs: 4.6 10*3/uL (ref 1.7–7.7)
Neutrophils Relative %: 66 %
Platelets: 211 10*3/uL (ref 150–400)
RBC: 4.71 MIL/uL (ref 3.87–5.11)
RDW: 12.9 % (ref 11.5–15.5)
WBC: 7 10*3/uL (ref 4.0–10.5)
nRBC: 0 % (ref 0.0–0.2)

## 2019-04-10 LAB — COMPREHENSIVE METABOLIC PANEL
ALT: 31 U/L (ref 0–44)
AST: 22 U/L (ref 15–41)
Albumin: 4.1 g/dL (ref 3.5–5.0)
Alkaline Phosphatase: 53 U/L (ref 38–126)
Anion gap: 9 (ref 5–15)
BUN: 11 mg/dL (ref 6–20)
CO2: 24 mmol/L (ref 22–32)
Calcium: 9.2 mg/dL (ref 8.9–10.3)
Chloride: 106 mmol/L (ref 98–111)
Creatinine, Ser: 0.81 mg/dL (ref 0.44–1.00)
GFR calc Af Amer: >60 mL/min (ref >60)
GFR calc non Af Amer: >60 mL/min (ref >60)
Glucose, Bld: 116 mg/dL — ABNORMAL HIGH (ref 70–99)
Potassium: 4.3 mmol/L (ref 3.5–5.1)
Sodium: 139 mmol/L (ref 135–145)
Total Bilirubin: 0.5 mg/dL (ref 0.3–1.2)
Total Protein: 7.9 g/dL (ref 6.5–8.1)

## 2019-04-10 NOTE — Progress Notes (Signed)
Patient reports having aches and pains in her joints getting worse.

## 2019-04-11 DIAGNOSIS — E559 Vitamin D deficiency, unspecified: Secondary | ICD-10-CM | POA: Insufficient documentation

## 2019-04-11 DIAGNOSIS — Z79811 Long term (current) use of aromatase inhibitors: Secondary | ICD-10-CM | POA: Insufficient documentation

## 2019-04-11 LAB — VITAMIN D 25 HYDROXY (VIT D DEFICIENCY, FRACTURES): Vit D, 25-Hydroxy: 19.3 ng/mL — ABNORMAL LOW (ref 30.0–100.0)

## 2019-04-11 MED ORDER — VITAMIN D 50 MCG (2000 UT) PO TABS: 2000.0000 [IU] | ORAL_TABLET | Freq: Every day | ORAL | 0 refills | Status: DC

## 2019-04-11 NOTE — Progress Notes (Signed)
[facility]  [name], [name] as PCP - General (Physician Assistant) [name], MD as Referring Physician (Family Medicine) [name], MD (General Surgery)  REASON FOR VISIT Follow up for treatment of Stage IA  ER+ PR+ breast cancer  HISTORY OF PRESENTING ILLNESS: 1 Norma Austin 58 y.o. female with PMH listed as below is referred by Dr.Cook to cancer center for evaluation and management of newly diagnosed breast caner.  Patient had routine mammogram screening which showed left breast mass. This was followed by a diagnostic Mammogram and ultrasound which showed an hypoechoic mass measuring 12 x 7 x 10 mm at 1:30, 7 cm from the nipple, corresponding to thespiculated mass seen mammographically. There is an adjacent near anechoic mass at 1 o'clock, 6 cm from the nipple measuring 6 x 4 x 4 mm. 1:30 mass was biopsy and showed invasive carcinoma of breast, unspecified type, no LVI, No DCIS. The 1 o'clock mass disappeared after biopsy of the 1:30 mass. It maybe a cyst which was poked by the needle for anesthesia.   2 Pathology: 02/24/2017 US guided breast mass biopsy showed invasive mammary carcinoma, grade 1, ER 90% PR90%, HER2 Equivocal by IHC, negative by FISH. S/p left breast lumpectomy and sentinel lymph node biopsy on 03/18/2017. Pathology showed 46m invasive mammary carcinoma of no specific type, margins are all negative, and all 3 sentinel lymph nodes were negative. ER/PR 90%, HER2 FISH negative. Mammaprint showed low risk.  3  lumpectomy followed by MammoSite radiation. Mammaprint indicates low risk. Adjuvant chemotherapy is not offered.   Interval History:  Patient with above oncology history reviewed by me present for follow-up management of stage IA ER/PR positive HER 2 negative breast cancer. Patient was last seen by me via virtual visit on 12/26/2018.  Patient has been taking letrozole daily.  She has manageable hot flashes.  He has  worsening aches and pains in her joints. Otherwise report doing well.    Review of Systems  Constitutional: Negative for chills, fever, malaise/fatigue and weight loss.  HENT: Negative for congestion, ear discharge, ear pain, nosebleeds, sinus pain and sore throat.   Eyes: Negative for double vision, photophobia, pain, discharge and redness.  Respiratory: Negative for cough, hemoptysis, sputum production, shortness of breath and wheezing.   Cardiovascular: Negative for chest pain, palpitations, orthopnea, claudication and leg swelling.  Gastrointestinal: Negative for abdominal pain, blood in stool, constipation, diarrhea, heartburn, melena, nausea and vomiting.  Genitourinary: Negative for dysuria, flank pain, frequency and hematuria.  Musculoskeletal: Positive for joint pain. Negative for myalgias and neck pain.  Skin: Negative for itching and rash.  Neurological: Negative for dizziness, tingling, tremors, focal weakness, weakness and headaches.  Endo/Heme/Allergies: Negative for environmental allergies. Does not bruise/bleed easily.       Hot flashes  Psychiatric/Behavioral: Negative for depression and hallucinations. The patient is not nervous/anxious.     MEDICAL HISTORY: Past Medical History:  Diagnosis Date  . Arthritis   . Breast cancer ([facility] 2018   INVASIVE MAMMARY CARCINOMA, T1c, N0, 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  . GERD (gastroesophageal reflux disease)   . Indigestion   . Osteopenia 12/26/2018  . Personal history of radiation therapy 2018   F/U left breast cancer  . Plantar fasciitis     SURGICAL HISTORY: Past Surgical History:  Procedure Laterality Date  . BREAST BIOPSY Left 02/24/2017   INVASIVE MAMMARY CARCINOMA  . BREAST LUMPECTOMY Left 2018   Invasive, F/U with radiation and  Letrozole   . BREAST LUMPECTOMY WITH SENTINEL LYMPH NODE BIOPSY Left 03/18/2017   Procedure: BREAST LUMPECTOMY WITH SENTINEL LYMPH NODE BX;  Surgeon: [name], MD;   Location: [facility] ORS;  Service: General;  Laterality: Left;  . BREAST MAMMOSITE Left 03/2017  . GANGLION CYST EXCISION  1990  . MOUTH SURGERY     4 BOTTOM FRONT DENTAL IMPLANTS    SOCIAL HISTORY: Social History   Socioeconomic History  . Marital status: Married    Spouse name: Not on file  . Number of children: Not on file  . Years of education: Not on file  . Highest education level: Not on file  Occupational History  . Not on file  Social Needs  . Financial resource strain: Not on file  . Food insecurity    Worry: Not on file    Inability: Not on file  . Transportation needs    Medical: Not on file    Non-medical: Not on file  Tobacco Use  . Smoking status: Former Smoker    Packs/day: 1.00    Years: 25.00    Pack years: 25.00    Types: Cigarettes    Quit date: 03/12/2007    Years since quitting: 12.0  . Smokeless tobacco: Never Used  Substance and Sexual Activity  . Alcohol use: Yes    Alcohol/week: 0.0 standard drinks    Comment: occasional  . Drug use: No  . Sexual activity: Not on file  Lifestyle  . Physical activity    Days per week: Not on file    Minutes per session: Not on file  . Stress: Not on file  Relationships  . Social Herbalist on phone: Not on file    Gets together: Not on file    Attends religious service: Not on file    Active member of club or organization: Not on file    Attends meetings of clubs or organizations: Not on file    Relationship status: Not on file  . Intimate partner violence    Fear of current or ex partner: Not on file    Emotionally abused: Not on file    Physically abused: Not on file    Forced sexual activity: Not on file  Other Topics Concern  . Not on file  Social History Narrative  . Not on file    FAMILY HISTORY Family History  Problem Relation Age of Onset  . Breast cancer Mother 31  . CVA Mother   . Lung cancer Father   . Thyroid cancer Cousin   . Colon cancer Neg Hx     ALLERGIES:  is  allergic to adhesive [tape].  MEDICATIONS:  Current Outpatient Medications  Medication Sig Dispense Refill  . acetaminophen (TYLENOL) 500 MG tablet Take 1,000 mg by mouth every 6 (six) hours as needed (for pain.).    Marland Kitchen diclofenac (VOLTAREN) 50 MG EC tablet Take 50 mg by mouth 2 (two) times daily as needed (for plantar fasciitis/arthritis.).     Marland Kitchen letrozole (FEMARA) 2.5 MG tablet Take 1 tablet (2.5 mg total) by mouth daily. 90 tablet 1  . loratadine (CLARITIN) 10 MG tablet Take 10 mg by mouth daily.    . rosuvastatin (CRESTOR) 10 MG tablet Take 10 mg by mouth daily.     No current facility-administered medications for this visit.     PHYSICAL EXAMINATION:  ECOG PERFORMANCE STATUS: 0 - Asymptomatic   Vitals:   04/10/19 1009  BP: 132/84  Pulse: 61  Resp: 16  Temp: 98.5 F (36.9 C)    Filed Weights   04/10/19 1009  Weight: 246 lb 6.4 oz (111.8 kg)     Physical Exam  Constitutional: She is oriented to person, place, and time. No distress.  HENT:  Head: Normocephalic and atraumatic.  Nose: Nose normal.  Mouth/Throat: Oropharynx is clear and moist. No oropharyngeal exudate.  Eyes: Pupils are equal, round, and reactive to light. EOM are normal. Left eye exhibits no discharge. No scleral icterus.  Neck: Normal range of motion. Neck supple. No JVD present.  Cardiovascular: Normal rate, regular rhythm and normal heart sounds.  No murmur heard. Pulmonary/Chest: Effort normal and breath sounds normal. No respiratory distress. She has no wheezes. She has no rales. She exhibits no tenderness.  Abdominal: Soft. She exhibits no distension and no mass. There is no abdominal tenderness. There is no rebound.  Musculoskeletal: Normal range of motion.        General: No tenderness or edema.  Lymphadenopathy:    She has no cervical adenopathy.  Neurological: She is alert and oriented to person, place, and time. No cranial nerve deficit. She exhibits normal muscle tone. Coordination normal.   Skin: Skin is warm and dry. No rash noted. She is not diaphoretic. No erythema.  Psychiatric: Affect and judgment normal.  Breast exam was performed in seated and lying down position. Patient is status post lumpectomy of left breast  with a well-healed surgical scar. No evidence of any palpable masses bilateral . No evidence of bilateral axillary adenopathy.    LABORATORY DATA: I have personally reviewed the data as listed: Pathology 7/18  Surgical Pathology   THIS IS AN ADDENDUM REPORT  CASE: [id number]  PATIENT: [facility]  Surgical Pathology Report  Reason for Addendum #1: Immunohistochemistry results   RADIOGRAPHIC STUDIES: I have personally reviewed the radiological images as listed and agree with the findings in the report 01/28/2017 Mammogram screening FINDINGS: In the left breast, a possible mass warrants further evaluation. In the right breast, no findings suspicious for malignancy. IMPRESSION: Further evaluation is suggested for possible mass in the left breast. 02/08/2017 Mammogram diagnostic left IMPRESSION: 1. Highly suspicious left breast mass at 1:30, 7 cm from the nipple. 2. Indeterminate mass at 1 o'clock, 6 cm from the nipple.  02/08/2017 2D DIGITAL DIAGNOSTIC LEFT MAMMOGRAM WITH CAD AND ADJUNCT TOMO ULTRASOUND LEFT BREAST COMPARISON:  Previous exam(s). ACR Breast Density Category b: There are scattered areas of fibroglandular density. FINDINGS: Additional views of the left breast demonstrates a persistent spiculated mass in the upper, outer left breast, corresponding to the mass seen on screening mammogram. There is a smaller, adjacent oval mass within the slightly more medial upper, outer left breast. Mammographic images were processed with CAD. On physical exam, there is palpable thickening at 1:30, 7 cm from the nipple in the area of concern. Ultrasound demonstrates an irregular, hypoechoic mass measuring 12 x 7 x 10 mm at 1:30, 7 cm from the  nipple, corresponding to the spiculated mass seen mammographically. There is an adjacent near anechoic mass at 1 o'clock, 6 cm from the nipple measuring 6 x 4 x 4 mm, corresponding to the additional mass seen mammographically. Ultrasound of the left axilla demonstrates no suspicious appearing axillary lymph nodes. IMPRESSION: 1. Highly suspicious left breast mass at 1:30, 7 cm from the nipple. 2. Indeterminate mass at 1 o'clock, 6 cm from the nipple. EXAM: ULTRASOUND GUIDED LEFT BREAST CORE NEEDLE BIOPSY COMPARISON:  Previous exam(s). FINDINGS:  I met with the patient and we discussed the procedure of ultrasound-guided biopsy, including benefits and alternatives. We discussed the high likelihood of a successful procedure. We discussed the risks of the procedure, including infection, bleeding, tissue injury, clip migration, and inadequate sampling. Informed written consent was given. The usual time-out protocol was performed immediately prior to the procedure.  Lesion quadrant: Upper outer quadrant  Using sterile technique and 1% Lidocaine as local anesthetic, under direct ultrasound visualization, a 12 gauge spring-loaded device was used to perform biopsy of the highly suspicious mass in the 1:30 o'clock position using an inferior approach. At the conclusion of the procedure a coil shaped tissue marker clip was deployed into the biopsy cavity. Follow up 2 view mammogram was performed and dictated separately.  Following the local anesthesia, the indeterminate mass, that was localized at the 1 o'clock position, could no longer be visualized. This appears to have been disrupted with the local anesthesia needle or possibly obscured by the infiltrated lidocaine. This strongly supports this lesion being benign cyst or dilated duct. IMPRESSION: Ultrasound guided biopsy of a left breast mass. No apparent Complications.  ASSESSMENT/PLAN Cancer Staging Malignant neoplasm of  upper-outer quadrant of left breast in female, estrogen receptor positive ([place]) Staging form: Breast, AJCC 8th Edition - Clinical stage from 03/03/2017: Stage IA (cT1c, cN0, cM0, G1, ER: Positive, PR: Positive, HER2: Equivocal) - Signed by Earlie Server, MD on 03/03/2017 - Pathologic stage from 04/30/2017: pT1c, pN0, cM0, ER: Positive, PR: Positive, HER2: Negative - Signed by Earlie Server, MD on 04/30/2017  1. Malignant neoplasm of upper-outer quadrant of left breast in female, estrogen receptor positive ([place])   2. Osteopenia, unspecified location   3. Vitamin D deficiency   4. Use of letrozole (Femara)   #Stage IA Breast cancer, tolerating adjuvant letrozole well.  Manageable hot flash and joint aches Continue. 02/27/2019 bilateral diagnostic breast mammogram images were independently reviewed by me and discussed with patient.  Left breast lumpectomy site is stable.  No mammographic evidence of malignancy in bilateral breast.  We will repeat another diagnostic mammogram in 1 year.  #Osteopenia, advised patient to continue take calcium 1000 mg daily.  Discussed with patient again about bisphosphonate treatment for osteopenia.  Patient Needs to obtain dental clearance.   #Vitamin D deficiency, vitamin D level came back low at 19.3.  I recommend patient to oral vitamin D supplementation 2000 units daily.  Prescription sent to pharmacy. She is due for next mammogram in June 2019. Patient usually gets scheduled though Dr.Byrnett's office.   Follow up in 4 months All questions were answered. The patient knows to call the clinic with any problems, questions or concerns.   Earlie Server MD PhD [facility] [phone] Phone: [phone] 04/11/19

## 2019-04-12 ENCOUNTER — Encounter: Payer: Self-pay | Admitting: *Deleted

## 2019-06-08 ENCOUNTER — Ambulatory Visit: Payer: BC Managed Care – PPO | Admitting: Radiation Oncology

## 2019-06-13 ENCOUNTER — Ambulatory Visit
Admission: RE | Admit: 2019-06-13 | Discharge: 2019-06-13 | Disposition: A | Discharge status: Home / Self Care | Payer: BC Managed Care – PPO | Source: Ambulatory Visit | Attending: Radiation Oncology | Admitting: Radiation Oncology

## 2019-06-13 ENCOUNTER — Encounter: Payer: Self-pay | Admitting: Radiation Oncology

## 2019-06-13 ENCOUNTER — Other Ambulatory Visit: Payer: Self-pay

## 2019-06-13 VITALS — BP 140/70 | HR 68 | Temp 97.3°F | Resp 16 | Wt 249.3 lb

## 2019-06-13 DIAGNOSIS — Z79811 Long term (current) use of aromatase inhibitors: Secondary | ICD-10-CM | POA: Diagnosis not present

## 2019-06-13 DIAGNOSIS — C50412 Malignant neoplasm of upper-outer quadrant of left female breast: Secondary | ICD-10-CM | POA: Diagnosis not present

## 2019-06-13 DIAGNOSIS — Z923 Personal history of irradiation: Secondary | ICD-10-CM | POA: Diagnosis not present

## 2019-06-13 DIAGNOSIS — Z170 Estrogen receptor positive status [ER+]: Secondary | ICD-10-CM | POA: Diagnosis not present

## 2019-06-13 NOTE — Progress Notes (Signed)
Radiation Oncology Follow up Note  Name: Norma Austin   Date:   06/13/2019 MRN:  [id number] DOB: 09-27-1960    This 58 y.o. female presents to the clinic today for 2-year follow-up status post accelerated partial breast radiation to her left breast for stage I ER/PR positive invasive mammary carcinoma.  REFERRING PROVIDER: Kerri Perches, PA-C  HPI: Patient is a 58 year old female now about 2 years having completed accelerated partial breast radiation to her left breast for stage I ER/PR positive invasive mammary carcinoma.  She is seen today in routine follow-up is doing well.  She specifically denies breast tenderness cough or bone pain..  She had a mammogram back in July which I have reviewed was BI-RADS 2 benign.  She is currently on Femara tolerating that well without side effect.  COMPLICATIONS OF TREATMENT: none  FOLLOW UP COMPLIANCE: keeps appointments   PHYSICAL EXAM:  BP 140/70 (BP Location: Right Arm, Patient Position: Sitting)   Pulse 68   Temp (!) 97.3 F (36.3 C) (Tympanic)   Resp 16   Wt 249 lb 4.8 oz (113.1 kg)   LMP 03/19/2011 (Approximate) Comment: LMP 2011 or 2012  BMI 39.05 kg/m  Lungs are clear to A&P cardiac examination essentially unremarkable with regular rate and rhythm. No dominant mass or nodularity is noted in either breast in 2 positions examined. Incision is well-healed. No axillary or supraclavicular adenopathy is appreciated. Cosmetic result is excellent.  Well-developed well-nourished patient in NAD. HEENT reveals PERLA, EOMI, discs not visualized.  Oral cavity is clear. No oral mucosal lesions are identified. Neck is clear without evidence of cervical or supraclavicular adenopathy. Lungs are clear to A&P. Cardiac examination is essentially unremarkable with regular rate and rhythm without murmur rub or thrill. Abdomen is benign with no organomegaly or masses noted. Motor sensory and DTR levels are equal and symmetric in the upper and lower extremities.  Cranial nerves II through XII are grossly intact. Proprioception is intact. No peripheral adenopathy or edema is identified. No motor or sensory levels are noted. Crude visual fields are within normal range.  RADIOLOGY RESULTS: Mammograms reviewed compatible with above-stated findings  PLAN: Present time patient is doing well with no evidence of disease 2 years out.  I am pleased with her overall progress.  She continues on letrozole without side effect.  I have asked to see her back in 1 year for follow-up.  Patient knows to call with any concerns at any time.  I would like to take this opportunity to thank you for allowing me to participate in the care of your patient.Noreene Filbert, MD

## 2019-07-28 ENCOUNTER — Other Ambulatory Visit: Payer: Self-pay

## 2019-07-28 ENCOUNTER — Inpatient Hospital Stay: Payer: BC Managed Care – PPO | Attending: Oncology

## 2019-07-28 DIAGNOSIS — M8580 Other specified disorders of bone density and structure, unspecified site: Secondary | ICD-10-CM | POA: Insufficient documentation

## 2019-07-28 DIAGNOSIS — C50412 Malignant neoplasm of upper-outer quadrant of left female breast: Secondary | ICD-10-CM | POA: Diagnosis not present

## 2019-07-28 DIAGNOSIS — Z170 Estrogen receptor positive status [ER+]: Secondary | ICD-10-CM | POA: Insufficient documentation

## 2019-07-28 DIAGNOSIS — E559 Vitamin D deficiency, unspecified: Secondary | ICD-10-CM | POA: Diagnosis not present

## 2019-07-28 LAB — CBC WITH DIFFERENTIAL/PLATELET
Abs Immature Granulocytes: 0.01 10*3/uL (ref 0.00–0.07)
Basophils Absolute: 0.1 10*3/uL (ref 0.0–0.1)
Basophils Relative: 1 %
Eosinophils Absolute: 0.2 10*3/uL (ref 0.0–0.5)
Eosinophils Relative: 2 %
HCT: 42 % (ref 36.0–46.0)
Hemoglobin: 13.8 g/dL (ref 12.0–15.0)
Immature Granulocytes: 0 %
Lymphocytes Relative: 24 %
Lymphs Abs: 1.9 10*3/uL (ref 0.7–4.0)
MCH: 29.7 pg (ref 26.0–34.0)
MCHC: 32.9 g/dL (ref 30.0–36.0)
MCV: 90.5 fL (ref 80.0–100.0)
Monocytes Absolute: 0.6 10*3/uL (ref 0.1–1.0)
Monocytes Relative: 7 %
Neutro Abs: 5.1 10*3/uL (ref 1.7–7.7)
Neutrophils Relative %: 66 %
Platelets: 201 10*3/uL (ref 150–400)
RBC: 4.64 MIL/uL (ref 3.87–5.11)
RDW: 12.6 % (ref 11.5–15.5)
WBC: 7.8 10*3/uL (ref 4.0–10.5)
nRBC: 0 % (ref 0.0–0.2)

## 2019-07-28 LAB — COMPREHENSIVE METABOLIC PANEL
ALT: 29 U/L (ref 0–44)
AST: 22 U/L (ref 15–41)
Albumin: 4.4 g/dL (ref 3.5–5.0)
Alkaline Phosphatase: 54 U/L (ref 38–126)
Anion gap: 8 (ref 5–15)
BUN: 23 mg/dL — ABNORMAL HIGH (ref 6–20)
CO2: 24 mmol/L (ref 22–32)
Calcium: 9.3 mg/dL (ref 8.9–10.3)
Chloride: 107 mmol/L (ref 98–111)
Creatinine, Ser: 0.87 mg/dL (ref 0.44–1.00)
GFR calc Af Amer: >60 mL/min (ref >60)
GFR calc non Af Amer: >60 mL/min (ref >60)
Glucose, Bld: 104 mg/dL — ABNORMAL HIGH (ref 70–99)
Potassium: 4.1 mmol/L (ref 3.5–5.1)
Sodium: 139 mmol/L (ref 135–145)
Total Bilirubin: 0.5 mg/dL (ref 0.3–1.2)
Total Protein: 8 g/dL (ref 6.5–8.1)

## 2019-07-31 ENCOUNTER — Other Ambulatory Visit: Payer: BC Managed Care – PPO

## 2019-07-31 ENCOUNTER — Encounter: Payer: Self-pay | Admitting: Oncology

## 2019-07-31 ENCOUNTER — Inpatient Hospital Stay (HOSPITAL_BASED_OUTPATIENT_CLINIC_OR_DEPARTMENT_OTHER): Payer: BC Managed Care – PPO | Admitting: Oncology

## 2019-07-31 DIAGNOSIS — C50412 Malignant neoplasm of upper-outer quadrant of left female breast: Secondary | ICD-10-CM | POA: Diagnosis not present

## 2019-07-31 DIAGNOSIS — E559 Vitamin D deficiency, unspecified: Secondary | ICD-10-CM

## 2019-07-31 DIAGNOSIS — M8580 Other specified disorders of bone density and structure, unspecified site: Secondary | ICD-10-CM | POA: Diagnosis not present

## 2019-07-31 DIAGNOSIS — Z79811 Long term (current) use of aromatase inhibitors: Secondary | ICD-10-CM

## 2019-07-31 DIAGNOSIS — Z170 Estrogen receptor positive status [ER+]: Secondary | ICD-10-CM

## 2019-07-31 MED ORDER — VITAMIN D 50 MCG (2000 UT) PO TABS: 2000.0000 [IU] | ORAL_TABLET | Freq: Every day | ORAL | 1 refills | Status: DC

## 2019-07-31 MED ORDER — LETROZOLE 2.5 MG PO TABS: 2.5000 mg | ORAL_TABLET | Freq: Every day | ORAL | 1 refills | Status: DC

## 2019-07-31 NOTE — Progress Notes (Signed)
Patient verified using two identifiers for virtual visit via telephone today.  Patient does not offer any problems today.  

## 2019-07-31 NOTE — Progress Notes (Signed)
HEMATOLOGY-ONCOLOGY TeleHEALTH VISIT PROGRESS NOTE  I connected with [place] on 07/31/19 at 10:00 AM EST by video enabled telemedicine visit and verified that I am speaking with the correct person using two identifiers. I discussed the limitations, risks, security and privacy concerns of performing an evaluation and management service by telemedicine and the availability of in-person appointments. I also discussed with the patient that there may be a patient responsible charge related to this service. The patient expressed understanding and agreed to proceed.   Other persons participating in the visit and their role in the encounter:  None  Patient's location: Home  Provider's location: office Chief Complaint: History of breast cancer   INTERVAL HISTORY Norma Austin is a 58 y.o. female who has above history reviewed by me today presents for follow up visit for management of history of breast cancer Problems and complaints are listed below:  Patient reports doing well.  She has no new complaints.  Does not have any concerns of her breast. Patient is taking letrozole 2.5 mg daily.  She continues to have chronic joint pains.  Hot flash has eased off. Patient takes oral vitamin D and calcium supplementation for osteopenia.  Last Zometa was given in May 2020.  She tolerated well. Review of Systems  Constitutional: Negative for appetite change, chills, fatigue and fever.  HENT:   Negative for hearing loss and voice change.   Eyes: Negative for eye problems.  Respiratory: Negative for chest tightness and cough.   Cardiovascular: Negative for chest pain.  Gastrointestinal: Negative for abdominal distention, abdominal pain and blood in stool.  Endocrine: Negative for hot flashes.  Genitourinary: Negative for difficulty urinating and frequency.   Musculoskeletal: Positive for arthralgias.  Skin: Negative for itching and rash.  Neurological: Negative for extremity weakness.   Hematological: Negative for adenopathy.  Psychiatric/Behavioral: Negative for confusion.    Past Medical History:  Diagnosis Date  . Arthritis   . Breast cancer ([place]) 2018   INVASIVE MAMMARY CARCINOMA, T1c, N0, 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  . GERD (gastroesophageal reflux disease)   . Indigestion   . Osteopenia 12/26/2018  . Personal history of radiation therapy 2018   F/U left breast cancer  . Plantar fasciitis    Past Surgical History:  Procedure Laterality Date  . BREAST BIOPSY Left 02/24/2017   INVASIVE MAMMARY CARCINOMA  . BREAST LUMPECTOMY Left 2018   Invasive, F/U with radiation and Letrozole   . BREAST LUMPECTOMY WITH SENTINEL LYMPH NODE BIOPSY Left 03/18/2017   Procedure: BREAST LUMPECTOMY WITH SENTINEL LYMPH NODE BX;  Surgeon: [name], MD;  Location: [facility] ORS;  Service: General;  Laterality: Left;  . BREAST MAMMOSITE Left 03/2017  . GANGLION CYST EXCISION  1990  . MOUTH SURGERY     4 BOTTOM FRONT DENTAL IMPLANTS    Family History  Problem Relation Age of Onset  . Breast cancer Mother 40  . CVA Mother   . Lung cancer Father   . Thyroid cancer Cousin   . Colon cancer Neg Hx     Social History   Socioeconomic History  . Marital status: Married    Spouse name: Not on file  . Number of children: Not on file  . Years of education: Not on file  . Highest education level: Not on file  Occupational History  . Not on file  Tobacco Use  . Smoking status: Former Smoker    Packs/day: 1.00    Years: 25.00  Pack years: 25.00    Types: Cigarettes    Quit date: 03/12/2007    Years since quitting: 12.3  . Smokeless tobacco: Never Used  Substance and Sexual Activity  . Alcohol use: Yes    Alcohol/week: 0.0 standard drinks    Comment: occasional  . Drug use: No  . Sexual activity: Not on file  Other Topics Concern  . Not on file  Social History Narrative  . Not on file   Social Determinants of Health   Financial Resource Strain:   .  Difficulty of Paying Living Expenses: Not on file  Food Insecurity:   . Worried About Charity fundraiser in the Last Year: Not on file  . Ran Out of Food in the Last Year: Not on file  Transportation Needs:   . Lack of Transportation (Medical): Not on file  . Lack of Transportation (Non-Medical): Not on file  Physical Activity:   . Days of Exercise per Week: Not on file  . Minutes of Exercise per Session: Not on file  Stress:   . Feeling of Stress : Not on file  Social Connections:   . Frequency of Communication with Friends and Family: Not on file  . Frequency of Social Gatherings with Friends and Family: Not on file  . Attends Religious Services: Not on file  . Active Member of Clubs or Organizations: Not on file  . Attends Archivist Meetings: Not on file  . Marital Status: Not on file  Intimate Partner Violence:   . Fear of Current or Ex-Partner: Not on file  . Emotionally Abused: Not on file  . Physically Abused: Not on file  . Sexually Abused: Not on file    Current Outpatient Medications on File Prior to Visit  Medication Sig Dispense Refill  . acetaminophen (TYLENOL) 500 MG tablet Take 1,000 mg by mouth every 6 (six) hours as needed (for pain.).    Marland Kitchen diclofenac (VOLTAREN) 50 MG EC tablet Take 50 mg by mouth 2 (two) times daily as needed (for plantar fasciitis/arthritis.).     Marland Kitchen loratadine (CLARITIN) 10 MG tablet Take 10 mg by mouth daily.    . rosuvastatin (CRESTOR) 10 MG tablet Take 10 mg by mouth daily.     No current facility-administered medications on file prior to visit.    Allergies  Allergen Reactions  . Adhesive [Tape]     WHELPS       Observations/Objective: Today's Vitals   07/31/19 1001  PainSc: 0-No pain   There is no height or weight on file to calculate BMI.  Physical Exam  Constitutional: She is oriented to person, place, and time. No distress.  Neurological: She is alert and oriented to person, place, and time.  Psychiatric: Mood  normal.    CBC    Component Value Date/Time   WBC 7.8 07/28/2019 1532   RBC 4.64 07/28/2019 1532   HGB 13.8 07/28/2019 1532   HCT 42.0 07/28/2019 1532   PLT 201 07/28/2019 1532   MCV 90.5 07/28/2019 1532   MCH 29.7 07/28/2019 1532   MCHC 32.9 07/28/2019 1532   RDW 12.6 07/28/2019 1532   LYMPHSABS 1.9 07/28/2019 1532   MONOABS 0.6 07/28/2019 1532   EOSABS 0.2 07/28/2019 1532   BASOSABS 0.1 07/28/2019 1532    CMP     Component Value Date/Time   NA 139 07/28/2019 1532   K 4.1 07/28/2019 1532   CL 107 07/28/2019 1532   CO2 24 07/28/2019 1532  GLUCOSE 104 (H) 07/28/2019 1532   BUN 23 (H) 07/28/2019 1532   CREATININE 0.87 07/28/2019 1532   CALCIUM 9.3 07/28/2019 1532   PROT 8.0 07/28/2019 1532   ALBUMIN 4.4 07/28/2019 1532   AST 22 07/28/2019 1532   ALT 29 07/28/2019 1532   ALKPHOS 54 07/28/2019 1532   BILITOT 0.5 07/28/2019 1532   GFRNONAA >60 07/28/2019 1532   GFRAA >60 07/28/2019 1532     Assessment and Plan: 1. Malignant neoplasm of upper-outer quadrant of left breast in female, estrogen receptor positive ([place])   2. Osteopenia, unspecified location   3. Vitamin D deficiency   4. Use of letrozole (Femara)     #History of breast cancer, she is clinically doing well tolerates aromatase inhibitor. Recommend patient to continue letrozole 2.5 mg daily.  Refill sent to pharmacy. Recommend annual mammogram.  Due July 2021 #Osteopenia, in the context of chronic aromatase inhibitor use.  She is due for repeat DEXA in May 2021. Patient has been on Zometa every 6 months.  I will arrange patient to receive Zometa in December 2020. Continue oral calcium supplementation. #Vitamin D deficiency, recommend patient to continue take oral vitamin D supplementation.  Will check levels at next visit.   Follow Up Instructions: 4 months   I discussed the assessment and treatment plan with the patient. The patient was provided an opportunity to ask questions and all were answered.  The patient agreed with the plan and demonstrated an understanding of the instructions.  The patient was advised to call back or seek an in-person evaluation if the symptoms worsen or if the condition fails to improve as anticipated.   Earlie Server, MD 07/31/2019 1:47 PM

## 2019-08-10 ENCOUNTER — Other Ambulatory Visit: Payer: Self-pay

## 2019-08-10 ENCOUNTER — Inpatient Hospital Stay: Payer: BC Managed Care – PPO

## 2019-08-10 VITALS — BP 143/85 | HR 80 | Temp 98.0°F | Resp 20

## 2019-08-10 DIAGNOSIS — C50412 Malignant neoplasm of upper-outer quadrant of left female breast: Secondary | ICD-10-CM | POA: Diagnosis not present

## 2019-08-10 DIAGNOSIS — M8580 Other specified disorders of bone density and structure, unspecified site: Secondary | ICD-10-CM

## 2019-08-10 DIAGNOSIS — Z170 Estrogen receptor positive status [ER+]: Secondary | ICD-10-CM

## 2019-08-10 MED ORDER — ZOLEDRONIC ACID 4 MG/100ML IV SOLN
4.0000 mg | Freq: Once | INTRAVENOUS | Status: AC
  Administered 2019-08-10: 4 mg via INTRAVENOUS
  Filled 2019-08-10: qty 100

## 2019-08-10 MED ORDER — SODIUM CHLORIDE 0.9 % IV SOLN
Freq: Once | INTRAVENOUS | Status: AC
  Filled 2019-08-10: qty 250

## 2019-11-24 ENCOUNTER — Inpatient Hospital Stay: Payer: Self-pay

## 2019-11-24 ENCOUNTER — Inpatient Hospital Stay: Payer: Self-pay | Admitting: Oncology

## 2019-12-04 ENCOUNTER — Inpatient Hospital Stay (HOSPITAL_BASED_OUTPATIENT_CLINIC_OR_DEPARTMENT_OTHER): Payer: Self-pay | Admitting: Oncology

## 2019-12-04 ENCOUNTER — Inpatient Hospital Stay: Payer: Self-pay | Attending: Oncology

## 2019-12-04 ENCOUNTER — Other Ambulatory Visit: Payer: Self-pay

## 2019-12-04 ENCOUNTER — Encounter: Payer: Self-pay | Admitting: Oncology

## 2019-12-04 VITALS — BP 146/87 | HR 71 | Temp 96.7°F | Resp 18 | Wt 258.1 lb

## 2019-12-04 DIAGNOSIS — Z79811 Long term (current) use of aromatase inhibitors: Secondary | ICD-10-CM | POA: Insufficient documentation

## 2019-12-04 DIAGNOSIS — M8580 Other specified disorders of bone density and structure, unspecified site: Secondary | ICD-10-CM | POA: Insufficient documentation

## 2019-12-04 DIAGNOSIS — Z170 Estrogen receptor positive status [ER+]: Secondary | ICD-10-CM | POA: Insufficient documentation

## 2019-12-04 DIAGNOSIS — C50412 Malignant neoplasm of upper-outer quadrant of left female breast: Secondary | ICD-10-CM | POA: Insufficient documentation

## 2019-12-04 DIAGNOSIS — C50912 Malignant neoplasm of unspecified site of left female breast: Secondary | ICD-10-CM

## 2019-12-04 DIAGNOSIS — E559 Vitamin D deficiency, unspecified: Secondary | ICD-10-CM | POA: Insufficient documentation

## 2019-12-04 DIAGNOSIS — Z87891 Personal history of nicotine dependence: Secondary | ICD-10-CM | POA: Insufficient documentation

## 2019-12-04 LAB — CBC WITH DIFFERENTIAL/PLATELET
Abs Immature Granulocytes: 0.02 10*3/uL (ref 0.00–0.07)
Basophils Absolute: 0.1 10*3/uL (ref 0.0–0.1)
Basophils Relative: 1 %
Eosinophils Absolute: 0.2 10*3/uL (ref 0.0–0.5)
Eosinophils Relative: 4 %
HCT: 41.4 % (ref 36.0–46.0)
Hemoglobin: 14.1 g/dL (ref 12.0–15.0)
Immature Granulocytes: 0 %
Lymphocytes Relative: 29 %
Lymphs Abs: 1.7 10*3/uL (ref 0.7–4.0)
MCH: 29.9 pg (ref 26.0–34.0)
MCHC: 34.1 g/dL (ref 30.0–36.0)
MCV: 87.9 fL (ref 80.0–100.0)
Monocytes Absolute: 0.4 10*3/uL (ref 0.1–1.0)
Monocytes Relative: 6 %
Neutro Abs: 3.5 10*3/uL (ref 1.7–7.7)
Neutrophils Relative %: 60 %
Platelets: 209 10*3/uL (ref 150–400)
RBC: 4.71 MIL/uL (ref 3.87–5.11)
RDW: 13.1 % (ref 11.5–15.5)
WBC: 5.9 10*3/uL (ref 4.0–10.5)
nRBC: 0 % (ref 0.0–0.2)

## 2019-12-04 LAB — COMPREHENSIVE METABOLIC PANEL
ALT: 26 U/L (ref 0–44)
AST: 18 U/L (ref 15–41)
Albumin: 4 g/dL (ref 3.5–5.0)
Alkaline Phosphatase: 39 U/L (ref 38–126)
Anion gap: 9 (ref 5–15)
BUN: 10 mg/dL (ref 6–20)
CO2: 24 mmol/L (ref 22–32)
Calcium: 9.2 mg/dL (ref 8.9–10.3)
Chloride: 106 mmol/L (ref 98–111)
Creatinine, Ser: 0.77 mg/dL (ref 0.44–1.00)
GFR calc Af Amer: >60 mL/min (ref >60)
GFR calc non Af Amer: >60 mL/min (ref >60)
Glucose, Bld: 114 mg/dL — ABNORMAL HIGH (ref 70–99)
Potassium: 4.1 mmol/L (ref 3.5–5.1)
Sodium: 139 mmol/L (ref 135–145)
Total Bilirubin: 0.6 mg/dL (ref 0.3–1.2)
Total Protein: 7.5 g/dL (ref 6.5–8.1)

## 2019-12-04 LAB — VITAMIN D 25 HYDROXY (VIT D DEFICIENCY, FRACTURES): Vit D, 25-Hydroxy: 30.18 ng/mL (ref 30–100)

## 2019-12-04 NOTE — Progress Notes (Signed)
[facility]  [name], [name] as PCP - General (Physician Assistant) [name], MD as Referring Physician (Family Medicine) [name], MD (General Surgery)  REASON FOR VISIT Follow up for treatment of Stage IA  ER+ PR+ breast cancer  HISTORY OF PRESENTING ILLNESS: 1 Edison J Wertheim 59 y.o. female with PMH listed as below is referred by Dr.Cook to cancer center for evaluation and management of newly diagnosed breast caner.  Patient had routine mammogram screening which showed left breast mass. This was followed by a diagnostic Mammogram and ultrasound which showed an hypoechoic mass measuring 12 x 7 x 10 mm at 1:30, 7 cm from the nipple, corresponding to thespiculated mass seen mammographically. There is an adjacent near anechoic mass at 1 o'clock, 6 cm from the nipple measuring 6 x 4 x 4 mm. 1:30 mass was biopsy and showed invasive carcinoma of breast, unspecified type, no LVI, No DCIS. The 1 o'clock mass disappeared after biopsy of the 1:30 mass. It maybe a cyst which was poked by the needle for anesthesia.   2 Pathology: 02/24/2017 US guided breast mass biopsy showed invasive mammary carcinoma, grade 1, ER 90% PR90%, HER2 Equivocal by IHC, negative by FISH. S/p left breast lumpectomy and sentinel lymph node biopsy on 03/18/2017. Pathology showed 19m invasive mammary carcinoma of no specific type, margins are all negative, and all 3 sentinel lymph nodes were negative. ER/PR 90%, HER2 FISH negative. Mammaprint showed low risk.  3  lumpectomy followed by MammoSite radiation. Mammaprint indicates low risk. Adjuvant chemotherapy is not offered.   Interval History:  Patient with above oncology history reviewed by me present for follow-up management of stage IA ER/PR positive HER 2 negative breast cancer. Patient has no new concerns today.  She takes letrozole 2.5 mg daily with manageable arthralgia. No particular breast concerns  today.   Review of Systems  Constitutional: Negative for chills, fever, malaise/fatigue and weight loss.  HENT: Negative for sore throat.   Eyes: Negative for redness.  Respiratory: Negative for cough, shortness of breath and wheezing.   Cardiovascular: Negative for chest pain, palpitations and leg swelling.  Gastrointestinal: Negative for abdominal pain, blood in stool, nausea and vomiting.  Genitourinary: Negative for dysuria.  Musculoskeletal: Positive for joint pain. Negative for myalgias.  Skin: Negative for rash.  Neurological: Negative for dizziness, tingling and tremors.  Endo/Heme/Allergies: Does not bruise/bleed easily.  Psychiatric/Behavioral: Negative for hallucinations.    MEDICAL HISTORY: Past Medical History:  Diagnosis Date  . Arthritis   . Breast cancer ([place] 2018   INVASIVE MAMMARY CARCINOMA, T1c, N0, 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  . GERD (gastroesophageal reflux disease)   . Indigestion   . Osteopenia 12/26/2018  . Personal history of radiation therapy 2018   F/U left breast cancer  . Plantar fasciitis     SURGICAL HISTORY: Past Surgical History:  Procedure Laterality Date  . BREAST BIOPSY Left 02/24/2017   INVASIVE MAMMARY CARCINOMA  . BREAST LUMPECTOMY Left 2018   Invasive, F/U with radiation and Letrozole   . BREAST LUMPECTOMY WITH SENTINEL LYMPH NODE BIOPSY Left 03/18/2017   Procedure: BREAST LUMPECTOMY WITH SENTINEL LYMPH NODE BX;  Surgeon: SChristene Lye MD;  Location: [facility] ORS;  Service: General;  Laterality: Left;  . BREAST MAMMOSITE Left 03/2017  . GANGLION CYST EXCISION  1990  . MOUTH SURGERY     4 BOTTOM FRONT DENTAL IMPLANTS    SOCIAL HISTORY: Social History   Socioeconomic History  .  Marital status: Married    Spouse name: Not on file  . Number of children: Not on file  . Years of education: Not on file  . Highest education level: Not on file  Occupational History  . Not on file  Tobacco Use  . Smoking status:  Former Smoker    Packs/day: 1.00    Years: 25.00    Pack years: 25.00    Types: Cigarettes    Quit date: 03/12/2007    Years since quitting: 12.7  . Smokeless tobacco: Never Used  Substance and Sexual Activity  . Alcohol use: Yes    Alcohol/week: 0.0 standard drinks    Comment: occasional  . Drug use: No  . Sexual activity: Not on file  Other Topics Concern  . Not on file  Social History Narrative  . Not on file   Social Determinants of Health   Financial Resource Strain:   . Difficulty of Paying Living Expenses:   Food Insecurity:   . Worried About Charity fundraiser in the Last Year:   . Arboriculturist in the Last Year:   Transportation Needs:   . Film/video editor (Medical):   Marland Kitchen Lack of Transportation (Non-Medical):   Physical Activity:   . Days of Exercise per Week:   . Minutes of Exercise per Session:   Stress:   . Feeling of Stress :   Social Connections:   . Frequency of Communication with Friends and Family:   . Frequency of Social Gatherings with Friends and Family:   . Attends Religious Services:   . Active Member of Clubs or Organizations:   . Attends Archivist Meetings:   Marland Kitchen Marital Status:   Intimate Partner Violence:   . Fear of Current or Ex-Partner:   . Emotionally Abused:   Marland Kitchen Physically Abused:   . Sexually Abused:     FAMILY HISTORY Family History  Problem Relation Age of Onset  . Breast cancer Mother 27  . CVA Mother   . Lung cancer Father   . Thyroid cancer Cousin   . Colon cancer Neg Hx     ALLERGIES:  is allergic to adhesive [tape].  MEDICATIONS:  Current Outpatient Medications  Medication Sig Dispense Refill  . acetaminophen (TYLENOL) 500 MG tablet Take 1,000 mg by mouth every 6 (six) hours as needed (for pain.).    Marland Kitchen Cholecalciferol (VITAMIN D) 50 MCG (2000 UT) tablet Take 1 tablet (2,000 Units total) by mouth daily. 90 tablet 1  . diclofenac (VOLTAREN) 50 MG EC tablet Take 50 mg by mouth 2 (two) times daily as  needed (for plantar fasciitis/arthritis.).     Marland Kitchen letrozole (FEMARA) 2.5 MG tablet Take 1 tablet (2.5 mg total) by mouth daily. 90 tablet 1  . loratadine (CLARITIN) 10 MG tablet Take 10 mg by mouth daily.    . rosuvastatin (CRESTOR) 10 MG tablet Take 10 mg by mouth daily.     No current facility-administered medications for this visit.    PHYSICAL EXAMINATION:  ECOG PERFORMANCE STATUS: 0 - Asymptomatic   Vitals:   12/04/19 0838  BP: (!) 146/87  Pulse: 71  Resp: 18  Temp: (!) 96.7 F (35.9 C)    Filed Weights   12/04/19 0838  Weight: 258 lb 1.6 oz (117.1 kg)     Physical Exam  Constitutional: She is oriented to person, place, and time. No distress.  HENT:  Head: Normocephalic and atraumatic.  Nose: Nose normal.  Mouth/Throat: Oropharynx is  clear and moist. No oropharyngeal exudate.  Eyes: Pupils are equal, round, and reactive to light. EOM are normal. Left eye exhibits no discharge. No scleral icterus.  Neck: No JVD present.  Cardiovascular: Normal rate, regular rhythm and normal heart sounds.  No murmur heard. Pulmonary/Chest: Effort normal and breath sounds normal. No respiratory distress. She has no wheezes. She has no rales. She exhibits no tenderness.  Abdominal: Soft. She exhibits no distension and no mass. There is no abdominal tenderness. There is no rebound.  Musculoskeletal:        General: No tenderness or edema. Normal range of motion.     Cervical back: Normal range of motion and neck supple.  Lymphadenopathy:    She has no cervical adenopathy.  Neurological: She is alert and oriented to person, place, and time. She exhibits normal muscle tone.  Skin: Skin is warm and dry. No rash noted. She is not diaphoretic. No erythema.  Psychiatric: Affect normal.  Breast exam was performed in seated and lying down position. Patient is status post lumpectomy of left breast  with a well-healed surgical scar. No evidence of any palpable masses bilateral . No evidence of  bilateral axillary adenopathy.    LABORATORY DATA: I have personally reviewed the data as listed: Pathology 7/18  Surgical Pathology   THIS IS AN ADDENDUM REPORT  CASE: [id number]  PATIENT: [facility]  Surgical Pathology Report  Reason for Addendum #1: Immunohistochemistry results   RADIOGRAPHIC STUDIES: I have personally reviewed the radiological images as listed and agree with the findings in the report 01/28/2017 Mammogram screening FINDINGS: In the left breast, a possible mass warrants further evaluation. In the right breast, no findings suspicious for malignancy. IMPRESSION: Further evaluation is suggested for possible mass in the left breast. 02/08/2017 Mammogram diagnostic left IMPRESSION: 1. Highly suspicious left breast mass at 1:30, 7 cm from the nipple. 2. Indeterminate mass at 1 o'clock, 6 cm from the nipple.  02/08/2017 2D DIGITAL DIAGNOSTIC LEFT MAMMOGRAM WITH CAD AND ADJUNCT TOMO ULTRASOUND LEFT BREAST COMPARISON:  Previous exam(s). ACR Breast Density Category b: There are scattered areas of fibroglandular density. FINDINGS: Additional views of the left breast demonstrates a persistent spiculated mass in the upper, outer left breast, corresponding to the mass seen on screening mammogram. There is a smaller, adjacent oval mass within the slightly more medial upper, outer left breast. Mammographic images were processed with CAD. On physical exam, there is palpable thickening at 1:30, 7 cm from the nipple in the area of concern. Ultrasound demonstrates an irregular, hypoechoic mass measuring 12 x 7 x 10 mm at 1:30, 7 cm from the nipple, corresponding to the spiculated mass seen mammographically. There is an adjacent near anechoic mass at 1 o'clock, 6 cm from the nipple measuring 6 x 4 x 4 mm, corresponding to the additional mass seen mammographically. Ultrasound of the left axilla demonstrates no suspicious appearing axillary lymph nodes. IMPRESSION: 1.  Highly suspicious left breast mass at 1:30, 7 cm from the nipple. 2. Indeterminate mass at 1 o'clock, 6 cm from the nipple. EXAM: ULTRASOUND GUIDED LEFT BREAST CORE NEEDLE BIOPSY COMPARISON:  Previous exam(s). FINDINGS: I met with the patient and we discussed the procedure of ultrasound-guided biopsy, including benefits and alternatives. We discussed the high likelihood of a successful procedure. We discussed the risks of the procedure, including infection, bleeding, tissue injury, clip migration, and inadequate sampling. Informed written consent was given. The usual time-out protocol was performed immediately prior to the procedure.  Lesion quadrant:  Upper outer quadrant  Using sterile technique and 1% Lidocaine as local anesthetic, under direct ultrasound visualization, a 12 gauge spring-loaded device was used to perform biopsy of the highly suspicious mass in the 1:30 o'clock position using an inferior approach. At the conclusion of the procedure a coil shaped tissue marker clip was deployed into the biopsy cavity. Follow up 2 view mammogram was performed and dictated separately.  Following the local anesthesia, the indeterminate mass, that was localized at the 1 o'clock position, could no longer be visualized. This appears to have been disrupted with the local anesthesia needle or possibly obscured by the infiltrated lidocaine. This strongly supports this lesion being benign cyst or dilated duct. IMPRESSION: Ultrasound guided biopsy of a left breast mass. No apparent Complications.  ASSESSMENT/PLAN Cancer Staging Malignant neoplasm of upper-outer quadrant of left breast in female, estrogen receptor positive ([place]) Staging form: Breast, AJCC 8th Edition - Clinical stage from 03/03/2017: Stage IA (cT1c, cN0, cM0, G1, ER: Positive, PR: Positive, HER2: Equivocal) - Signed by Earlie Server, MD on 03/03/2017 - Pathologic stage from 04/30/2017: pT1c, pN0, cM0, ER: Positive, PR: Positive,  HER2: Negative - Signed by Earlie Server, MD on 04/30/2017  1. Er+ PR+ carcinoma of breast, left ([place])   2. Osteopenia, unspecified location   3. Vitamin D deficiency   4. Use of letrozole (Femara)   #Stage IA Breast cancer, tolerating adjuvant letrozole well.  Manageable hot flash and joint aches Continue. Patient is due for bilateral diagnostic mammogram in July 2021.  Patient reports that she usually gets it done through surgery's office.  She will call Dr. Dahlia Byes to arrange.  #Osteopenia, advised patient to continue take calcium and vitamin D supplementation. Patient has been on Zometa treatments every 6 months.  Due in June 2021.  We will arrange.  She will also get BMP prior to getting Zometa treatment. Patient is due for bone density testing in May 2021.  #Vitamin D deficiency,  today's vitamin D levels are pending.   Continue vitamin D supplementation.   Follow up in  6 months.  All questions were answered. The patient knows to call the clinic with any problems, questions or concerns.   Earlie Server MD PhD [facility] [phone] Phone: [phone] 12/04/19

## 2019-12-04 NOTE — Progress Notes (Signed)
Patient here for follow up. No new breast problems voiced.

## 2019-12-18 ENCOUNTER — Ambulatory Visit
Admission: RE | Admit: 2019-12-18 | Discharge: 2019-12-18 | Disposition: A | Discharge status: Home / Self Care | Payer: Self-pay | Source: Ambulatory Visit | Attending: Oncology | Admitting: Oncology

## 2019-12-18 DIAGNOSIS — C50912 Malignant neoplasm of unspecified site of left female breast: Secondary | ICD-10-CM | POA: Insufficient documentation

## 2019-12-18 DIAGNOSIS — Z170 Estrogen receptor positive status [ER+]: Secondary | ICD-10-CM | POA: Insufficient documentation

## 2019-12-18 DIAGNOSIS — M8580 Other specified disorders of bone density and structure, unspecified site: Secondary | ICD-10-CM | POA: Insufficient documentation

## 2020-01-22 ENCOUNTER — Other Ambulatory Visit: Payer: Self-pay

## 2020-01-22 DIAGNOSIS — C50412 Malignant neoplasm of upper-outer quadrant of left female breast: Secondary | ICD-10-CM

## 2020-01-30 ENCOUNTER — Other Ambulatory Visit: Payer: Self-pay

## 2020-01-30 ENCOUNTER — Ambulatory Visit: Payer: Self-pay

## 2020-02-06 ENCOUNTER — Other Ambulatory Visit: Payer: Self-pay

## 2020-02-06 ENCOUNTER — Inpatient Hospital Stay: Payer: Self-pay | Attending: Oncology

## 2020-02-06 ENCOUNTER — Inpatient Hospital Stay: Payer: Self-pay

## 2020-02-06 VITALS — BP 139/75 | HR 84 | Temp 99.2°F | Resp 16

## 2020-02-06 DIAGNOSIS — Z170 Estrogen receptor positive status [ER+]: Secondary | ICD-10-CM

## 2020-02-06 DIAGNOSIS — M8580 Other specified disorders of bone density and structure, unspecified site: Secondary | ICD-10-CM | POA: Insufficient documentation

## 2020-02-06 LAB — BASIC METABOLIC PANEL
Anion gap: 7 (ref 5–15)
BUN: 11 mg/dL (ref 6–20)
CO2: 28 mmol/L (ref 22–32)
Calcium: 9.1 mg/dL (ref 8.9–10.3)
Chloride: 103 mmol/L (ref 98–111)
Creatinine, Ser: 0.81 mg/dL (ref 0.44–1.00)
GFR calc Af Amer: >60 mL/min (ref >60)
GFR calc non Af Amer: >60 mL/min (ref >60)
Glucose, Bld: 131 mg/dL — ABNORMAL HIGH (ref 70–99)
Potassium: 4.2 mmol/L (ref 3.5–5.1)
Sodium: 138 mmol/L (ref 135–145)

## 2020-02-06 MED ORDER — SODIUM CHLORIDE 0.9 % IV SOLN
Freq: Once | INTRAVENOUS | Status: AC
  Filled 2020-02-06: qty 250

## 2020-02-06 MED ORDER — ZOLEDRONIC ACID 4 MG/100ML IV SOLN
4.0000 mg | Freq: Once | INTRAVENOUS | Status: AC
  Administered 2020-02-06: 4 mg via INTRAVENOUS
  Filled 2020-02-06: qty 100

## 2020-03-12 ENCOUNTER — Other Ambulatory Visit: Payer: Self-pay

## 2020-03-18 ENCOUNTER — Ambulatory Visit
Admission: RE | Admit: 2020-03-18 | Discharge: 2020-03-18 | Disposition: A | Discharge status: Home / Self Care | Payer: Self-pay | Source: Ambulatory Visit | Attending: Surgery | Admitting: Surgery

## 2020-03-18 ENCOUNTER — Other Ambulatory Visit: Payer: Self-pay

## 2020-03-18 DIAGNOSIS — C50412 Malignant neoplasm of upper-outer quadrant of left female breast: Secondary | ICD-10-CM

## 2020-03-18 DIAGNOSIS — Z170 Estrogen receptor positive status [ER+]: Secondary | ICD-10-CM | POA: Insufficient documentation

## 2020-03-27 ENCOUNTER — Ambulatory Visit (INDEPENDENT_AMBULATORY_CARE_PROVIDER_SITE_OTHER): Payer: Self-pay | Admitting: Surgery

## 2020-03-27 ENCOUNTER — Encounter: Payer: Self-pay | Admitting: Surgery

## 2020-03-27 ENCOUNTER — Other Ambulatory Visit: Payer: Self-pay

## 2020-03-27 VITALS — BP 146/82 | HR 81 | Temp 98.2°F | Wt 252.0 lb

## 2020-03-27 DIAGNOSIS — C50412 Malignant neoplasm of upper-outer quadrant of left female breast: Secondary | ICD-10-CM

## 2020-03-27 DIAGNOSIS — Z170 Estrogen receptor positive status [ER+]: Secondary | ICD-10-CM

## 2020-03-27 NOTE — Patient Instructions (Signed)
The patient has been asked to return to the office in one year with a bilateral diagnostic mammogram.Continue self breast exams. Call office for any new breast issues or concerns.  

## 2020-03-27 NOTE — Progress Notes (Signed)
Follow-up    1 yr f/u recall bil diag mammo [facility] 02/27/19, C50.412+    HPI: 59 year old female status post lumpectomy and sentinel node biopsy by Dr. Jamal Collin on August 2018.  ER, PR positive.    No new issues.  She had a  mammogram that I have personally reviewed showing no evidence of recurrence or other suspicious lesions 03/2020.      Past Medical History:  Diagnosis Date  . Arthritis   . Breast cancer ([place]) 2018   INVASIVE MAMMARY CARCINOMA, T1c, N0, 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  . GERD (gastroesophageal reflux disease)   . Indigestion   . Osteopenia 12/26/2018  . Personal history of radiation therapy 2018   F/U left breast cancer  . Plantar fasciitis          Past Surgical History:  Procedure Laterality Date  . BREAST BIOPSY Left 02/24/2017   INVASIVE MAMMARY CARCINOMA  . BREAST LUMPECTOMY Left 2018   Invasive, F/U with radiation and Letrozole   . BREAST LUMPECTOMY WITH SENTINEL LYMPH NODE BIOPSY Left 03/18/2017   Procedure: BREAST LUMPECTOMY WITH SENTINEL LYMPH NODE BX;  Surgeon: [name], MD;  Location: [facility] ORS;  Service: General;  Laterality: Left;  . BREAST MAMMOSITE Left 03/2017  . GANGLION CYST EXCISION  1990  . MOUTH SURGERY     4 BOTTOM FRONT DENTAL IMPLANTS         Family History  Problem Relation Age of Onset  . Breast cancer Mother 61  . CVA Mother   . Lung cancer Father   . Thyroid cancer Cousin   . Colon cancer Neg Hx     Social History:  reports that she quit smoking about 13 years ago. Her smoking use included cigarettes. She has a 25.00 pack-year smoking history. She has never used smokeless tobacco. She reports current alcohol use. She reports that she does not use drugs.  Allergies:       Allergies  Allergen Reactions  . Adhesive [Tape]     WHELPS    Medications reviewed.    ROS Full ROS performed and is otherwise negative other than what is stated in HPI VS  reviewed  Physical Exam NAD, alert Neck: no LAd, no JVD, supple BREAST: Left lumpectomy and ax scar. No infection, no masses.. No LAD, no masses, no nipple DC Abd: soft, nt   Assessment/Plan: 59 year old female with history of breast cancer on the left side now with a recent mammogram showing no evidence of malignancy and physical exam also benign.  Tolerating AI. We will see her back in 1 year with a repeat mammogram.   Greater than 50% of the 25 minutes  visit was spent in counseling/coordination of care   Caroleen Hamman, MD [facility]

## 2020-06-07 ENCOUNTER — Encounter: Payer: Self-pay | Admitting: Oncology

## 2020-06-07 ENCOUNTER — Inpatient Hospital Stay: Payer: Self-pay | Attending: Oncology

## 2020-06-07 ENCOUNTER — Inpatient Hospital Stay (HOSPITAL_BASED_OUTPATIENT_CLINIC_OR_DEPARTMENT_OTHER): Payer: Self-pay | Admitting: Oncology

## 2020-06-07 VITALS — BP 139/81 | HR 66 | Temp 97.1°F | Resp 16 | Wt 249.6 lb

## 2020-06-07 DIAGNOSIS — E559 Vitamin D deficiency, unspecified: Secondary | ICD-10-CM

## 2020-06-07 DIAGNOSIS — Z79811 Long term (current) use of aromatase inhibitors: Secondary | ICD-10-CM

## 2020-06-07 DIAGNOSIS — C50912 Malignant neoplasm of unspecified site of left female breast: Secondary | ICD-10-CM

## 2020-06-07 DIAGNOSIS — C50412 Malignant neoplasm of upper-outer quadrant of left female breast: Secondary | ICD-10-CM | POA: Insufficient documentation

## 2020-06-07 DIAGNOSIS — Z170 Estrogen receptor positive status [ER+]: Secondary | ICD-10-CM | POA: Insufficient documentation

## 2020-06-07 DIAGNOSIS — M8580 Other specified disorders of bone density and structure, unspecified site: Secondary | ICD-10-CM

## 2020-06-07 DIAGNOSIS — Z87891 Personal history of nicotine dependence: Secondary | ICD-10-CM | POA: Insufficient documentation

## 2020-06-07 LAB — CBC WITH DIFFERENTIAL/PLATELET
Abs Immature Granulocytes: 0.02 10*3/uL (ref 0.00–0.07)
Basophils Absolute: 0.1 10*3/uL (ref 0.0–0.1)
Basophils Relative: 1 %
Eosinophils Absolute: 0.1 10*3/uL (ref 0.0–0.5)
Eosinophils Relative: 3 %
HCT: 39.9 % (ref 36.0–46.0)
Hemoglobin: 14.1 g/dL (ref 12.0–15.0)
Immature Granulocytes: 0 %
Lymphocytes Relative: 27 %
Lymphs Abs: 1.4 10*3/uL (ref 0.7–4.0)
MCH: 30.8 pg (ref 26.0–34.0)
MCHC: 35.3 g/dL (ref 30.0–36.0)
MCV: 87.1 fL (ref 80.0–100.0)
Monocytes Absolute: 0.4 10*3/uL (ref 0.1–1.0)
Monocytes Relative: 8 %
Neutro Abs: 3.2 10*3/uL (ref 1.7–7.7)
Neutrophils Relative %: 61 %
Platelets: 184 10*3/uL (ref 150–400)
RBC: 4.58 MIL/uL (ref 3.87–5.11)
RDW: 13 % (ref 11.5–15.5)
WBC: 5.2 10*3/uL (ref 4.0–10.5)
nRBC: 0 % (ref 0.0–0.2)

## 2020-06-07 LAB — COMPREHENSIVE METABOLIC PANEL
ALT: 28 U/L (ref 0–44)
AST: 20 U/L (ref 15–41)
Albumin: 4.1 g/dL (ref 3.5–5.0)
Alkaline Phosphatase: 36 U/L — ABNORMAL LOW (ref 38–126)
Anion gap: 6 (ref 5–15)
BUN: 13 mg/dL (ref 8–23)
CO2: 26 mmol/L (ref 22–32)
Calcium: 8.9 mg/dL (ref 8.9–10.3)
Chloride: 105 mmol/L (ref 98–111)
Creatinine, Ser: 0.76 mg/dL (ref 0.44–1.00)
GFR, Estimated: >60 mL/min (ref >60)
Glucose, Bld: 103 mg/dL — ABNORMAL HIGH (ref 70–99)
Potassium: 4.3 mmol/L (ref 3.5–5.1)
Sodium: 137 mmol/L (ref 135–145)
Total Bilirubin: 0.6 mg/dL (ref 0.3–1.2)
Total Protein: 7.6 g/dL (ref 6.5–8.1)

## 2020-06-07 NOTE — Progress Notes (Signed)
Patient denies new problems/concerns today.   °

## 2020-06-07 NOTE — Progress Notes (Signed)
[facility]  [name], [name] as PCP - General (Physician Assistant) [name], MD as Referring Physician (Family Medicine) [name], MD (General Surgery)  REASON FOR VISIT Follow up for treatment of Stage IA  ER+ PR+ breast cancer  HISTORY OF PRESENTING ILLNESS: 1 Norma Austin 59 y.o. female with PMH listed as below is referred by Dr.Cook to cancer center for evaluation and management of newly diagnosed breast caner.  Patient had routine mammogram screening which showed left breast mass. This was followed by a diagnostic Mammogram and ultrasound which showed an hypoechoic mass measuring 12 x 7 x 10 mm at 1:30, 7 cm from the nipple, corresponding to thespiculated mass seen mammographically. There is an adjacent near anechoic mass at 1 o'clock, 6 cm from the nipple measuring 6 x 4 x 4 mm. 1:30 mass was biopsy and showed invasive carcinoma of breast, unspecified type, no LVI, No DCIS. The 1 o'clock mass disappeared after biopsy of the 1:30 mass. It maybe a cyst which was poked by the needle for anesthesia.   2 Pathology: 02/24/2017 US guided breast mass biopsy showed invasive mammary carcinoma, grade 1, ER 90% PR90%, HER2 Equivocal by IHC, negative by FISH. S/p left breast lumpectomy and sentinel lymph node biopsy on 03/18/2017. Pathology showed 83mm invasive mammary carcinoma of no specific type, margins are all negative, and all 3 sentinel lymph nodes were negative. ER/PR 90%, HER2 FISH negative. Mammaprint showed low risk.  3  lumpectomy followed by MammoSite radiation. Mammaprint indicates low risk. Adjuvant chemotherapy is not offered.   Interval History:  Patient with above oncology history reviewed by me present for follow-up management of stage IA ER/PR positive HER 2 negative breast cancer. She takes letrozole 2.5 mg daily with manageable arthralgia. No particular breast concerns today. Bilateral lower extremity pain is worse and  is on disability now.    Review of Systems  Constitutional: Negative for chills, fever, malaise/fatigue and weight loss.  HENT: Negative for sore throat.   Eyes: Negative for redness.  Respiratory: Negative for cough, shortness of breath and wheezing.   Cardiovascular: Negative for chest pain, palpitations and leg swelling.  Gastrointestinal: Negative for abdominal pain, blood in stool, nausea and vomiting.  Genitourinary: Negative for dysuria.  Musculoskeletal: Positive for joint pain. Negative for myalgias.  Skin: Negative for rash.  Neurological: Negative for dizziness, tingling and tremors.  Endo/Heme/Allergies: Does not bruise/bleed easily.  Psychiatric/Behavioral: Negative for hallucinations.    MEDICAL HISTORY: Past Medical History:  Diagnosis Date  . Arthritis   . Breast cancer ([place]) 2018   INVASIVE MAMMARY CARCINOMA, T1c, N0, 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  . GERD (gastroesophageal reflux disease)   . Indigestion   . Osteopenia 12/26/2018  . Personal history of radiation therapy 2018   F/U left breast cancer  . Plantar fasciitis     SURGICAL HISTORY: Past Surgical History:  Procedure Laterality Date  . BREAST BIOPSY Left 02/24/2017   INVASIVE MAMMARY CARCINOMA  . BREAST LUMPECTOMY Left 2018   Invasive, F/U with radiation and Letrozole   . BREAST LUMPECTOMY WITH SENTINEL LYMPH NODE BIOPSY Left 03/18/2017   Procedure: BREAST LUMPECTOMY WITH SENTINEL LYMPH NODE BX;  Surgeon: [name], MD;  Location: [facility] ORS;  Service: General;  Laterality: Left;  . BREAST MAMMOSITE Left 03/2017  . GANGLION CYST EXCISION  1990  . MOUTH SURGERY     4 BOTTOM FRONT DENTAL IMPLANTS    SOCIAL HISTORY: Social  History   Socioeconomic History  . Marital status: Married    Spouse name: Not on file  . Number of children: Not on file  . Years of education: Not on file  . Highest education level: Not on file  Occupational History  . Not on file  Tobacco Use  .  Smoking status: Former Smoker    Packs/day: 1.00    Years: 25.00    Pack years: 25.00    Types: Cigarettes    Quit date: 03/12/2007    Years since quitting: 13.2  . Smokeless tobacco: Never Used  Vaping Use  . Vaping Use: Never used  Substance and Sexual Activity  . Alcohol use: Yes    Alcohol/week: 0.0 standard drinks    Comment: occasional  . Drug use: No  . Sexual activity: Not on file  Other Topics Concern  . Not on file  Social History Narrative  . Not on file   Social Determinants of Health   Financial Resource Strain:   . Difficulty of Paying Living Expenses: Not on file  Food Insecurity:   . Worried About Charity fundraiser in the Last Year: Not on file  . Ran Out of Food in the Last Year: Not on file  Transportation Needs:   . Lack of Transportation (Medical): Not on file  . Lack of Transportation (Non-Medical): Not on file  Physical Activity:   . Days of Exercise per Week: Not on file  . Minutes of Exercise per Session: Not on file  Stress:   . Feeling of Stress : Not on file  Social Connections:   . Frequency of Communication with Friends and Family: Not on file  . Frequency of Social Gatherings with Friends and Family: Not on file  . Attends Religious Services: Not on file  . Active Member of Clubs or Organizations: Not on file  . Attends Archivist Meetings: Not on file  . Marital Status: Not on file  Intimate Partner Violence:   . Fear of Current or Ex-Partner: Not on file  . Emotionally Abused: Not on file  . Physically Abused: Not on file  . Sexually Abused: Not on file    FAMILY HISTORY Family History  Problem Relation Age of Onset  . Breast cancer Mother 66  . CVA Mother   . Lung cancer Father   . Thyroid cancer Cousin   . Colon cancer Neg Hx     ALLERGIES:  is allergic to adhesive [tape].  MEDICATIONS:  Current Outpatient Medications  Medication Sig Dispense Refill  . acetaminophen (TYLENOL) 500 MG tablet Take 1,000 mg by  mouth every 6 (six) hours as needed (for pain.).    Marland Kitchen Cholecalciferol (VITAMIN D) 50 MCG (2000 UT) tablet Take 1 tablet (2,000 Units total) by mouth daily. 90 tablet 1  . diclofenac (VOLTAREN) 50 MG EC tablet Take 50 mg by mouth 2 (two) times daily as needed (for plantar fasciitis/arthritis.).     Marland Kitchen letrozole (FEMARA) 2.5 MG tablet Take 1 tablet (2.5 mg total) by mouth daily. 90 tablet 1  . loratadine (CLARITIN) 10 MG tablet Take 10 mg by mouth daily.    . rosuvastatin (CRESTOR) 10 MG tablet Take 10 mg by mouth daily.     No current facility-administered medications for this visit.    PHYSICAL EXAMINATION:  ECOG PERFORMANCE STATUS: 0 - Asymptomatic   Vitals:   06/07/20 1006  BP: 139/81  Pulse: 66  Resp: 16  Temp: (!) 97.1 F (36.2  C)    Filed Weights   06/07/20 1006  Weight: 249 lb 9.6 oz (113.2 kg)     Physical Exam Constitutional:      General: She is not in acute distress.    Appearance: She is not diaphoretic.  HENT:     Head: Normocephalic and atraumatic.     Nose: Nose normal.     Mouth/Throat:     Pharynx: No oropharyngeal exudate.  Eyes:     General: No scleral icterus.       Left eye: No discharge.     Pupils: Pupils are equal, round, and reactive to light.  Neck:     Vascular: No JVD.  Cardiovascular:     Rate and Rhythm: Normal rate and regular rhythm.     Heart sounds: Normal heart sounds. No murmur heard.   Pulmonary:     Effort: Pulmonary effort is normal. No respiratory distress.     Breath sounds: Normal breath sounds. No wheezing or rales.  Chest:     Chest wall: No tenderness.  Abdominal:     General: There is no distension.     Palpations: Abdomen is soft. There is no mass.     Tenderness: There is no abdominal tenderness. There is no rebound.  Musculoskeletal:        General: No tenderness. Normal range of motion.     Cervical back: Normal range of motion and neck supple.  Lymphadenopathy:     Cervical: No cervical adenopathy.   Skin:    General: Skin is warm and dry.     Findings: No erythema or rash.  Neurological:     Mental Status: She is alert and oriented to person, place, and time.     Motor: No abnormal muscle tone.  Psychiatric:        Mood and Affect: Affect normal.   Breast exam was performed in seated and lying down position. Patient is status post lumpectomy of left breast  with a well-healed surgical scar. No evidence of any palpable masses bilateral . No evidence of bilateral axillary adenopathy.    LABORATORY DATA: I have personally reviewed the data as listed: Pathology 7/18  Surgical Pathology   THIS IS AN ADDENDUM REPORT  CASE: [id number]  PATIENT: [facility]  Surgical Pathology Report  Reason for Addendum #1: Immunohistochemistry results   RADIOGRAPHIC STUDIES: I have personally reviewed the radiological images as listed and agree with the findings in the report 01/28/2017 Mammogram screening FINDINGS: In the left breast, a possible mass warrants further evaluation. In the right breast, no findings suspicious for malignancy. IMPRESSION: Further evaluation is suggested for possible mass in the left breast. 02/08/2017 Mammogram diagnostic left IMPRESSION: 1. Highly suspicious left breast mass at 1:30, 7 cm from the nipple. 2. Indeterminate mass at 1 o'clock, 6 cm from the nipple.  02/08/2017 2D DIGITAL DIAGNOSTIC LEFT MAMMOGRAM WITH CAD AND ADJUNCT TOMO ULTRASOUND LEFT BREAST COMPARISON:  Previous exam(s). ACR Breast Density Category b: There are scattered areas of fibroglandular density. FINDINGS: Additional views of the left breast demonstrates a persistent spiculated mass in the upper, outer left breast, corresponding to the mass seen on screening mammogram. There is a smaller, adjacent oval mass within the slightly more medial upper, outer left breast. Mammographic images were processed with CAD. On physical exam, there is palpable thickening at 1:30, 7 cm from the  nipple in the area of concern. Ultrasound demonstrates an irregular, hypoechoic mass measuring 12 x 7 x 10 mm at  1:30, 7 cm from the nipple, corresponding to the spiculated mass seen mammographically. There is an adjacent near anechoic mass at 1 o'clock, 6 cm from the nipple measuring 6 x 4 x 4 mm, corresponding to the additional mass seen mammographically. Ultrasound of the left axilla demonstrates no suspicious appearing axillary lymph nodes. IMPRESSION: 1. Highly suspicious left breast mass at 1:30, 7 cm from the nipple. 2. Indeterminate mass at 1 o'clock, 6 cm from the nipple. EXAM: ULTRASOUND GUIDED LEFT BREAST CORE NEEDLE BIOPSY COMPARISON:  Previous exam(s). FINDINGS: I met with the patient and we discussed the procedure of ultrasound-guided biopsy, including benefits and alternatives. We discussed the high likelihood of a successful procedure. We discussed the risks of the procedure, including infection, bleeding, tissue injury, clip migration, and inadequate sampling. Informed written consent was given. The usual time-out protocol was performed immediately prior to the procedure.  Lesion quadrant: Upper outer quadrant  Using sterile technique and 1% Lidocaine as local anesthetic, under direct ultrasound visualization, a 12 gauge spring-loaded device was used to perform biopsy of the highly suspicious mass in the 1:30 o'clock position using an inferior approach. At the conclusion of the procedure a coil shaped tissue marker clip was deployed into the biopsy cavity. Follow up 2 view mammogram was performed and dictated separately.  Following the local anesthesia, the indeterminate mass, that was localized at the 1 o'clock position, could no longer be visualized. This appears to have been disrupted with the local anesthesia needle or possibly obscured by the infiltrated lidocaine. This strongly supports this lesion being benign cyst or dilated  duct. IMPRESSION: Ultrasound guided biopsy of a left breast mass. No apparent Complications.  ASSESSMENT/PLAN Cancer Staging Malignant neoplasm of upper-outer quadrant of left breast in female, estrogen receptor positive ([place]) Staging form: Breast, AJCC 8th Edition - Clinical stage from 03/03/2017: Stage IA (cT1c, cN0, cM0, G1, ER: Positive, PR: Positive, HER2: Equivocal) - Signed by Earlie Server, MD on 03/03/2017 - Pathologic stage from 04/30/2017: pT1c, pN0, cM0, ER: Positive, PR: Positive, HER2: Negative - Signed by Earlie Server, MD on 04/30/2017  1. Er+ PR+ carcinoma of breast, left ([place])   2. Osteopenia, unspecified location   3. Vitamin D deficiency   4. Aromatase inhibitor use   #Stage IA Breast cancer,  Labs are reviewed and discussed with patient. Clinically she is doing well. Tolerating Adjuvant Letrozole, continue. Refills sent.  Bilaterally mammogram annually has been managed by Dr.Pabon. last done in August 2021. No recurrence.   # Osteopenia, continue calcium and vitamin d supplementation.  # Patient has been on Zometa treatments every 6 months.  Due in December 2021.  We will arrange.  She will also get BMP prior to getting Zometa treatment. #Vitamin D deficiency,  Continue vitamin D supplementation.   Follow up in  6 months.  All questions were answered. The patient knows to call the clinic with any problems, questions or concerns.   Earlie Server MD PhD [facility] [phone] [phone] 06/07/20

## 2020-06-12 ENCOUNTER — Ambulatory Visit: Admission: RE | Admit: 2020-06-12 | Payer: Self-pay | Source: Ambulatory Visit | Admitting: Radiation Oncology

## 2020-06-12 DIAGNOSIS — Z170 Estrogen receptor positive status [ER+]: Secondary | ICD-10-CM

## 2020-07-18 ENCOUNTER — Encounter: Payer: Self-pay | Admitting: Oncology

## 2020-07-30 ENCOUNTER — Inpatient Hospital Stay: Payer: Self-pay

## 2020-07-30 ENCOUNTER — Inpatient Hospital Stay: Payer: Self-pay | Attending: Oncology

## 2020-07-30 VITALS — BP 159/84 | HR 86 | Temp 97.5°F | Resp 18

## 2020-07-30 DIAGNOSIS — C50412 Malignant neoplasm of upper-outer quadrant of left female breast: Secondary | ICD-10-CM

## 2020-07-30 DIAGNOSIS — M8580 Other specified disorders of bone density and structure, unspecified site: Secondary | ICD-10-CM

## 2020-07-30 DIAGNOSIS — Z170 Estrogen receptor positive status [ER+]: Secondary | ICD-10-CM | POA: Insufficient documentation

## 2020-07-30 DIAGNOSIS — C50912 Malignant neoplasm of unspecified site of left female breast: Secondary | ICD-10-CM | POA: Insufficient documentation

## 2020-07-30 LAB — BASIC METABOLIC PANEL
Anion gap: 7 (ref 5–15)
BUN: 12 mg/dL (ref 8–23)
CO2: 28 mmol/L (ref 22–32)
Calcium: 9.5 mg/dL (ref 8.9–10.3)
Chloride: 101 mmol/L (ref 98–111)
Creatinine, Ser: 0.85 mg/dL (ref 0.44–1.00)
GFR, Estimated: >60 mL/min (ref >60)
Glucose, Bld: 107 mg/dL — ABNORMAL HIGH (ref 70–99)
Potassium: 4.2 mmol/L (ref 3.5–5.1)
Sodium: 136 mmol/L (ref 135–145)

## 2020-07-30 MED ORDER — SODIUM CHLORIDE 0.9 % IV SOLN
Freq: Once | INTRAVENOUS | Status: AC
  Filled 2020-07-30: qty 250

## 2020-07-30 MED ORDER — ZOLEDRONIC ACID 4 MG/100ML IV SOLN
4.0000 mg | Freq: Once | INTRAVENOUS | Status: AC
  Administered 2020-07-30: 15:00:00 4 mg via INTRAVENOUS
  Filled 2020-07-30: qty 100

## 2020-12-06 ENCOUNTER — Encounter: Payer: Self-pay | Admitting: Oncology

## 2020-12-06 ENCOUNTER — Inpatient Hospital Stay: Payer: Self-pay

## 2020-12-06 ENCOUNTER — Inpatient Hospital Stay: Payer: Self-pay | Attending: Oncology

## 2020-12-06 ENCOUNTER — Other Ambulatory Visit: Payer: Self-pay

## 2020-12-06 ENCOUNTER — Inpatient Hospital Stay (HOSPITAL_BASED_OUTPATIENT_CLINIC_OR_DEPARTMENT_OTHER): Payer: Self-pay | Admitting: Oncology

## 2020-12-06 VITALS — BP 133/79 | HR 74 | Temp 98.7°F | Resp 16 | Wt 253.5 lb

## 2020-12-06 DIAGNOSIS — L989 Disorder of the skin and subcutaneous tissue, unspecified: Secondary | ICD-10-CM | POA: Insufficient documentation

## 2020-12-06 DIAGNOSIS — Z79811 Long term (current) use of aromatase inhibitors: Secondary | ICD-10-CM | POA: Insufficient documentation

## 2020-12-06 DIAGNOSIS — Z87891 Personal history of nicotine dependence: Secondary | ICD-10-CM | POA: Insufficient documentation

## 2020-12-06 DIAGNOSIS — Z170 Estrogen receptor positive status [ER+]: Secondary | ICD-10-CM | POA: Insufficient documentation

## 2020-12-06 DIAGNOSIS — M8580 Other specified disorders of bone density and structure, unspecified site: Secondary | ICD-10-CM | POA: Insufficient documentation

## 2020-12-06 DIAGNOSIS — C50412 Malignant neoplasm of upper-outer quadrant of left female breast: Secondary | ICD-10-CM | POA: Insufficient documentation

## 2020-12-06 DIAGNOSIS — E559 Vitamin D deficiency, unspecified: Secondary | ICD-10-CM

## 2020-12-06 DIAGNOSIS — C50912 Malignant neoplasm of unspecified site of left female breast: Secondary | ICD-10-CM

## 2020-12-06 LAB — CBC WITH DIFFERENTIAL/PLATELET
Abs Immature Granulocytes: 0.01 10*3/uL (ref 0.00–0.07)
Basophils Absolute: 0.1 10*3/uL (ref 0.0–0.1)
Basophils Relative: 1 %
Eosinophils Absolute: 0.2 10*3/uL (ref 0.0–0.5)
Eosinophils Relative: 3 %
HCT: 41.6 % (ref 36.0–46.0)
Hemoglobin: 14.5 g/dL (ref 12.0–15.0)
Immature Granulocytes: 0 %
Lymphocytes Relative: 25 %
Lymphs Abs: 1.5 10*3/uL (ref 0.7–4.0)
MCH: 30.3 pg (ref 26.0–34.0)
MCHC: 34.9 g/dL (ref 30.0–36.0)
MCV: 86.8 fL (ref 80.0–100.0)
Monocytes Absolute: 0.4 10*3/uL (ref 0.1–1.0)
Monocytes Relative: 7 %
Neutro Abs: 3.7 10*3/uL (ref 1.7–7.7)
Neutrophils Relative %: 64 %
Platelets: 200 10*3/uL (ref 150–400)
RBC: 4.79 MIL/uL (ref 3.87–5.11)
RDW: 13 % (ref 11.5–15.5)
WBC: 5.8 10*3/uL (ref 4.0–10.5)
nRBC: 0 % (ref 0.0–0.2)

## 2020-12-06 LAB — COMPREHENSIVE METABOLIC PANEL
ALT: 33 U/L (ref 0–44)
AST: 26 U/L (ref 15–41)
Albumin: 4 g/dL (ref 3.5–5.0)
Alkaline Phosphatase: 39 U/L (ref 38–126)
Anion gap: 10 (ref 5–15)
BUN: 10 mg/dL (ref 8–23)
CO2: 25 mmol/L (ref 22–32)
Calcium: 9.1 mg/dL (ref 8.9–10.3)
Chloride: 104 mmol/L (ref 98–111)
Creatinine, Ser: 0.96 mg/dL (ref 0.44–1.00)
GFR, Estimated: >60 mL/min (ref >60)
Glucose, Bld: 88 mg/dL (ref 70–99)
Potassium: 4.2 mmol/L (ref 3.5–5.1)
Sodium: 139 mmol/L (ref 135–145)
Total Bilirubin: 0.7 mg/dL (ref 0.3–1.2)
Total Protein: 7.5 g/dL (ref 6.5–8.1)

## 2020-12-06 NOTE — Progress Notes (Signed)
[facility]  Patient Care Team: Leyva, Teresa, PA-C as PCP - General (Physician Assistant) Patel, Sarah, MD as Referring Physician (Family Medicine) Sankar, Seeplaputhur G, MD (General Surgery) Yu, Zhou, MD as Consulting Physician (Hematology and Oncology)  REASON FOR VISIT Follow up for treatment of Stage IA  ER+ PR+ breast cancer  HISTORY OF PRESENTING ILLNESS: 1 Norma Austin 59 y.o. female with PMH listed as below is referred by Dr.Cook to cancer center for evaluation and management of newly diagnosed breast caner.  Patient had routine mammogram screening which showed left breast mass. This was followed by a diagnostic Mammogram and ultrasound which showed an hypoechoic mass measuring 12 x 7 x 10 mm at 1:30, 7 cm from the nipple, corresponding to thespiculated mass seen mammographically. There is an adjacent near anechoic mass at 1 o'clock, 6 cm from the nipple measuring 6 x 4 x 4 mm. 1:30 mass was biopsy and showed invasive carcinoma of breast, unspecified type, no LVI, No DCIS. The 1 o'clock mass disappeared after biopsy of the 1:30 mass. It maybe a cyst which was poked by the needle for anesthesia.   2 Pathology: 02/24/2017 US guided breast mass biopsy showed invasive mammary carcinoma, grade 1, ER 90% PR90%, HER2 Equivocal by IHC, negative by FISH. S/p left breast lumpectomy and sentinel lymph node biopsy on 03/18/2017. Pathology showed 15mm invasive mammary carcinoma of no specific type, margins are all negative, and all 3 sentinel lymph nodes were negative. ER/PR 90%, HER2 FISH negative. Mammaprint showed low risk.  3  lumpectomy followed by MammoSite radiation. Mammaprint indicates low risk. Adjuvant chemotherapy is not offered.   Interval History:  Patient with above oncology history reviewed by me present for follow-up management of stage IA ER/PR positive HER 2 negative breast cancer. Norma Austin takes letrozole 2.5 mg daily with manageable arthralgia.  Patient is  currently to take care of her mother. No new complaints.   Review of Systems  Constitutional: Negative for chills, fever, malaise/fatigue and weight loss.  HENT: Negative for sore throat.   Eyes: Negative for redness.  Respiratory: Negative for cough, shortness of breath and wheezing.   Cardiovascular: Negative for chest pain, palpitations and leg swelling.  Gastrointestinal: Negative for abdominal pain, blood in stool, nausea and vomiting.  Genitourinary: Negative for dysuria.  Musculoskeletal: Positive for joint pain. Negative for myalgias.  Skin: Negative for rash.  Neurological: Negative for dizziness, tingling and tremors.  Endo/Heme/Allergies: Does not bruise/bleed easily.  Psychiatric/Behavioral: Negative for hallucinations.    MEDICAL HISTORY: Past Medical History:  Diagnosis Date  . Arthritis   . Breast cancer (HCC) 2018   INVASIVE MAMMARY CARCINOMA, T1c, N0, 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  . GERD (gastroesophageal reflux disease)   . Indigestion   . Osteopenia 12/26/2018  . Personal history of radiation therapy 2018   F/U left breast cancer  . Plantar fasciitis     SURGICAL HISTORY: Past Surgical History:  Procedure Laterality Date  . BREAST BIOPSY Left 02/24/2017   INVASIVE MAMMARY CARCINOMA  . BREAST LUMPECTOMY Left 2018   Invasive, F/U with radiation and Letrozole   . BREAST LUMPECTOMY WITH SENTINEL LYMPH NODE BIOPSY Left 03/18/2017   Procedure: BREAST LUMPECTOMY WITH SENTINEL LYMPH NODE BX;  Surgeon: Sankar, Seeplaputhur G, MD;  Location: [facility] ORS;  Service: General;  Laterality: Left;  . BREAST MAMMOSITE Left 03/2017  . GANGLION CYST EXCISION  1990  . MOUTH SURGERY     4 BOTTOM FRONT DENTAL IMPLANTS      SOCIAL HISTORY: Social History   Socioeconomic History  . Marital status: Married    Spouse name: Not on file  . Number of children: Not on file  . Years of education: Not on file  . Highest education level: Not on file  Occupational History   . Not on file  Tobacco Use  . Smoking status: Former Smoker    Packs/day: 1.00    Years: 25.00    Pack years: 25.00    Types: Cigarettes    Quit date: 03/12/2007    Years since quitting: 13.7  . Smokeless tobacco: Never Used  Vaping Use  . Vaping Use: Never used  Substance and Sexual Activity  . Alcohol use: Yes    Alcohol/week: 0.0 standard drinks    Comment: occasional  . Drug use: No  . Sexual activity: Not on file  Other Topics Concern  . Not on file  Social History Narrative  . Not on file   Social Determinants of Health   Financial Resource Strain: Not on file  Food Insecurity: Not on file  Transportation Needs: Not on file  Physical Activity: Not on file  Stress: Not on file  Social Connections: Not on file  Intimate Partner Violence: Not on file    FAMILY HISTORY Family History  Problem Relation Age of Onset  . Breast cancer Mother 40  . CVA Mother   . Lung cancer Father   . Thyroid cancer Cousin   . Colon cancer Neg Hx     ALLERGIES:  is allergic to adhesive [tape].  MEDICATIONS:  Current Outpatient Medications  Medication Sig Dispense Refill  . acetaminophen (TYLENOL) 500 MG tablet Take 1,000 mg by mouth every 6 (six) hours as needed (for pain.).    Marland Kitchen Cholecalciferol (VITAMIN D) 50 MCG (2000 UT) tablet Take 1 tablet (2,000 Units total) by mouth daily. 90 tablet 1  . diclofenac (VOLTAREN) 50 MG EC tablet Take 50 mg by mouth 2 (two) times daily as needed (for plantar fasciitis/arthritis.).     Marland Kitchen letrozole (FEMARA) 2.5 MG tablet Take 1 tablet (2.5 mg total) by mouth daily. 90 tablet 1  . loratadine (CLARITIN) 10 MG tablet Take 10 mg by mouth daily.    . rosuvastatin (CRESTOR) 10 MG tablet Take 10 mg by mouth daily.     No current facility-administered medications for this visit.    PHYSICAL EXAMINATION:  ECOG PERFORMANCE STATUS: 0 - Asymptomatic   Vitals:   12/06/20 0947  BP: 133/79  Pulse: 74  Resp: 16  Temp: 98.7 F (37.1 C)  SpO2: 97%     Filed Weights   12/06/20 0947  Weight: 253 lb 8 oz (115 kg)     Physical Exam Constitutional:      General: Norma Austin is not in acute distress.    Appearance: Norma Austin is not diaphoretic.  HENT:     Head: Normocephalic and atraumatic.     Nose: Nose normal.     Mouth/Throat:     Pharynx: No oropharyngeal exudate.  Eyes:     General: No scleral icterus.       Left eye: No discharge.     Pupils: Pupils are equal, round, and reactive to light.  Neck:     Vascular: No JVD.  Cardiovascular:     Rate and Rhythm: Normal rate and regular rhythm.     Heart sounds: Normal heart sounds. No murmur heard.   Pulmonary:     Effort: Pulmonary effort is normal. No respiratory distress.  Breath sounds: Normal breath sounds. No wheezing or rales.  Chest:     Chest wall: No tenderness.  Abdominal:     General: There is no distension.     Palpations: Abdomen is soft. There is no mass.     Tenderness: There is no abdominal tenderness. There is no rebound.  Musculoskeletal:        General: No tenderness. Normal range of motion.     Cervical back: Normal range of motion and neck supple.  Lymphadenopathy:     Cervical: No cervical adenopathy.  Skin:    General: Skin is warm and dry.     Findings: No erythema or rash.  Neurological:     Mental Status: Norma Austin is alert and oriented to person, place, and time.     Motor: No abnormal muscle tone.  Psychiatric:        Mood and Affect: Affect normal.   Breast exam was performed in seated and lying down position. Patient is status post lumpectomy of left breast  with a well-healed surgical scar. No evidence of any palpable masses bilateral . No evidence of bilateral axillary adenopathy.  Focal area, 5mm raised skin lesion of her right axillary    LABORATORY DATA: I have personally reviewed the data as listed: Pathology 7/18  Surgical Pathology   THIS IS AN ADDENDUM REPORT  CASE: [id number]  PATIENT: Patryce Sherrer  Surgical Pathology Report   Reason for Addendum #1: Immunohistochemistry results   RADIOGRAPHIC STUDIES: I have personally reviewed the radiological images as listed and agree with the findings in the report 01/28/2017 Mammogram screening FINDINGS: In the left breast, a possible mass warrants further evaluation. In the right breast, no findings suspicious for malignancy. IMPRESSION: Further evaluation is suggested for possible mass in the left breast. 02/08/2017 Mammogram diagnostic left IMPRESSION: 1. Highly suspicious left breast mass at 1:30, 7 cm from the nipple. 2. Indeterminate mass at 1 o'clock, 6 cm from the nipple.  02/08/2017 2D DIGITAL DIAGNOSTIC LEFT MAMMOGRAM WITH CAD AND ADJUNCT TOMO ULTRASOUND LEFT BREAST COMPARISON:  Previous exam(s). ACR Breast Density Category b: There are scattered areas of fibroglandular density. FINDINGS: Additional views of the left breast demonstrates a persistent spiculated mass in the upper, outer left breast, corresponding to the mass seen on screening mammogram. There is a smaller, adjacent oval mass within the slightly more medial upper, outer left breast. Mammographic images were processed with CAD. On physical exam, there is palpable thickening at 1:30, 7 cm from the nipple in the area of concern. Ultrasound demonstrates an irregular, hypoechoic mass measuring 12 x 7 x 10 mm at 1:30, 7 cm from the nipple, corresponding to the spiculated mass seen mammographically. There is an adjacent near anechoic mass at 1 o'clock, 6 cm from the nipple measuring 6 x 4 x 4 mm, corresponding to the additional mass seen mammographically. Ultrasound of the left axilla demonstrates no suspicious appearing axillary lymph nodes. IMPRESSION: 1. Highly suspicious left breast mass at 1:30, 7 cm from the nipple. 2. Indeterminate mass at 1 o'clock, 6 cm from the nipple. EXAM: ULTRASOUND GUIDED LEFT BREAST CORE NEEDLE BIOPSY COMPARISON:  Previous exam(s). FINDINGS: I met with the  patient and we discussed the procedure of ultrasound-guided biopsy, including benefits and alternatives. We discussed the high likelihood of a successful procedure. We discussed the risks of the procedure, including infection, bleeding, tissue injury, clip migration, and inadequate sampling. Informed written consent was given. The usual time-out protocol was performed immediately prior to the procedure.    Lesion quadrant: Upper outer quadrant  Using sterile technique and 1% Lidocaine as local anesthetic, under direct ultrasound visualization, a 12 gauge spring-loaded device was used to perform biopsy of the highly suspicious mass in the 1:30 o'clock position using an inferior approach. At the conclusion of the procedure a coil shaped tissue marker clip was deployed into the biopsy cavity. Follow up 2 view mammogram was performed and dictated separately.  Following the local anesthesia, the indeterminate mass, that was localized at the 1 o'clock position, could no longer be visualized. This appears to have been disrupted with the local anesthesia needle or possibly obscured by the infiltrated lidocaine. This strongly supports this lesion being benign cyst or dilated duct. IMPRESSION: Ultrasound guided biopsy of a left breast mass. No apparent Complications.  ASSESSMENT/PLAN Cancer Staging Malignant neoplasm of upper-outer quadrant of left breast in female, estrogen receptor positive (HCC) Staging form: Breast, AJCC 8th Edition - Clinical stage from 03/03/2017: Stage IA (cT1c, cN0, cM0, G1, ER: Positive, PR: Positive, HER2: Equivocal) - Signed by Yu, Zhou, MD on 03/03/2017 Neoadjuvant therapy: No Method of lymph node assessment: Clinical Histologic grading system: 3 grade system Laterality: Left Tumor size (mm): 12 Lymph-vascular invasion (LVI): LVI not present (absent)/not identified - Pathologic stage from 04/30/2017: pT1c, pN0, cM0, ER: Positive, PR: Positive, HER2: Negative -  Signed by Yu, Zhou, MD on 04/30/2017 HER2-FISH interpretation: Negative  1. Malignant neoplasm of upper-outer quadrant of left breast in female, estrogen receptor positive (HCC)   2. Osteopenia, unspecified location   3. Vitamin D deficiency   4. Aromatase inhibitor use   #Stage IA Breast cancer,  Labs are reviewed and discussed with patient .  Clinically Norma Austin is doing well. Continue adjuvant letrozole.  Refills were sent Bilateral mammogram annually through Dr.Pabon's office, due in August 2022 Axillary skin lesion, possible folliculitis.  Monitor clinically.  Asked patient to notify us if lesion persists.  # Osteopenia, continue calcium and vitamin d supplementation.  # Patient has been on Zometa treatments every 6 months.  Due 15 January 2021.  Norma Austin will also get BMP prior to getting Zometa treatment. #Vitamin D deficiency,  Continue vitamin D supplementation.   Follow up in  6 months.  All questions were answered. The patient knows to call the clinic with any problems, questions or concerns.   Zhou Yu MD PhD Hematolgy Oncology [facility] at [facility] Pager- [phone] Phone: [phone] 12/06/20   

## 2020-12-06 NOTE — Progress Notes (Signed)
Patient denies new problems/concerns today.   °

## 2021-01-17 ENCOUNTER — Inpatient Hospital Stay: Payer: Self-pay

## 2021-01-17 ENCOUNTER — Other Ambulatory Visit: Payer: Self-pay | Admitting: Nurse Practitioner

## 2021-01-17 ENCOUNTER — Inpatient Hospital Stay: Payer: Self-pay | Attending: Oncology

## 2021-01-17 VITALS — BP 127/84 | HR 71 | Temp 97.4°F | Resp 18

## 2021-01-17 DIAGNOSIS — M8580 Other specified disorders of bone density and structure, unspecified site: Secondary | ICD-10-CM | POA: Insufficient documentation

## 2021-01-17 DIAGNOSIS — C50412 Malignant neoplasm of upper-outer quadrant of left female breast: Secondary | ICD-10-CM

## 2021-01-17 LAB — BASIC METABOLIC PANEL
Anion gap: 11 (ref 5–15)
BUN: 10 mg/dL (ref 8–23)
CO2: 21 mmol/L — ABNORMAL LOW (ref 22–32)
Calcium: 8.7 mg/dL — ABNORMAL LOW (ref 8.9–10.3)
Chloride: 105 mmol/L (ref 98–111)
Creatinine, Ser: 0.71 mg/dL (ref 0.44–1.00)
GFR, Estimated: >60 mL/min (ref >60)
Glucose, Bld: 110 mg/dL — ABNORMAL HIGH (ref 70–99)
Potassium: 3.9 mmol/L (ref 3.5–5.1)
Sodium: 137 mmol/L (ref 135–145)

## 2021-01-17 MED ORDER — ZOLEDRONIC ACID 4 MG/100ML IV SOLN
4.0000 mg | Freq: Once | INTRAVENOUS | Status: AC
  Administered 2021-01-17: 11:00:00 4 mg via INTRAVENOUS
  Filled 2021-01-17: qty 100

## 2021-01-17 MED ORDER — SODIUM CHLORIDE 0.9% FLUSH
10.0000 mL | Freq: Once | INTRAVENOUS | Status: DC | PRN
  Filled 2021-01-17: qty 10

## 2021-01-17 MED ORDER — HEPARIN SOD (PORK) LOCK FLUSH 100 UNIT/ML IV SOLN
250.0000 [IU] | Freq: Once | INTRAVENOUS | Status: DC | PRN
  Filled 2021-01-17: qty 5

## 2021-01-17 MED ORDER — SODIUM CHLORIDE 0.9% FLUSH
3.0000 mL | Freq: Once | INTRAVENOUS | Status: DC | PRN
  Filled 2021-01-17: qty 3

## 2021-01-17 MED ORDER — HEPARIN SOD (PORK) LOCK FLUSH 100 UNIT/ML IV SOLN
500.0000 [IU] | Freq: Once | INTRAVENOUS | Status: DC | PRN
  Filled 2021-01-17: qty 5

## 2021-01-17 MED ORDER — SODIUM CHLORIDE 0.9 % IV SOLN
Freq: Once | INTRAVENOUS | Status: AC
  Filled 2021-01-17: qty 250

## 2021-01-17 NOTE — Progress Notes (Signed)
Zometa 4 mg every 6 months added to treatment plan. BMP entered and reviewed today.

## 2021-02-17 ENCOUNTER — Other Ambulatory Visit: Payer: Self-pay

## 2021-02-17 DIAGNOSIS — Z1231 Encounter for screening mammogram for malignant neoplasm of breast: Secondary | ICD-10-CM

## 2021-04-02 ENCOUNTER — Ambulatory Visit: Payer: Self-pay

## 2021-04-02 DIAGNOSIS — M79676 Pain in unspecified toe(s): Secondary | ICD-10-CM

## 2021-04-09 ENCOUNTER — Other Ambulatory Visit: Payer: Self-pay | Admitting: Internal Medicine

## 2021-04-09 DIAGNOSIS — M1990 Unspecified osteoarthritis, unspecified site: Secondary | ICD-10-CM

## 2021-04-15 ENCOUNTER — Ambulatory Visit: Payer: Self-pay | Attending: Oncology

## 2021-04-15 ENCOUNTER — Other Ambulatory Visit: Payer: Self-pay

## 2021-04-15 ENCOUNTER — Ambulatory Visit
Admission: RE | Admit: 2021-04-15 | Discharge: 2021-04-15 | Disposition: A | Discharge status: Home / Self Care | Payer: Self-pay | Source: Ambulatory Visit | Attending: Oncology | Admitting: Oncology

## 2021-04-15 VITALS — BP 149/80 | HR 67 | Temp 98.1°F | Ht 67.0 in | Wt 259.6 lb

## 2021-04-15 DIAGNOSIS — Z0000 Encounter for general adult medical examination without abnormal findings: Secondary | ICD-10-CM

## 2021-04-15 NOTE — Progress Notes (Signed)
  Subjective:     Patient ID: Norma Austin, female   DOB: 12/31/1960, 60 y.o.   MRN: [id number]  HPI   Review of Systems     Objective:   Physical Exam Chest:  Breasts:    Right: No swelling, bleeding, inverted nipple, mass, nipple discharge, skin change or tenderness.     Left: No swelling, bleeding, inverted nipple, mass, nipple discharge, skin change or tenderness.       Assessment:     60 year old with history of left breast cancer with lumpectomy returns for annual screening.  Patient screened, and meets BCCCP eligibility.  Patient does not have insurance, Medicare or Medicaid.  Instructed patient on breast self awareness using teach back method.  Clinical breast exam unremarkable.  No mass or lump palpated.  Risk Assessment     Risk Scores       04/15/2021   Last edited by: Rico Junker, RN   5-year risk: 6.8 %   Lifetime risk: 27.5 %               Plan:     Sent for bilateral screening mammogram.

## 2021-05-02 ENCOUNTER — Ambulatory Visit
Admission: RE | Admit: 2021-05-02 | Discharge: 2021-05-02 | Disposition: A | Discharge status: Home / Self Care | Payer: Disability Insurance | Source: Ambulatory Visit | Attending: Internal Medicine | Admitting: Internal Medicine

## 2021-05-02 ENCOUNTER — Other Ambulatory Visit: Payer: Self-pay

## 2021-05-02 ENCOUNTER — Encounter: Payer: Self-pay | Admitting: Oncology

## 2021-05-02 ENCOUNTER — Ambulatory Visit
Admission: RE | Admit: 2021-05-02 | Discharge: 2021-05-02 | Disposition: A | Discharge status: Home / Self Care | Payer: Disability Insurance | Attending: Internal Medicine | Admitting: Internal Medicine

## 2021-05-02 DIAGNOSIS — M1990 Unspecified osteoarthritis, unspecified site: Secondary | ICD-10-CM | POA: Insufficient documentation

## 2021-05-08 ENCOUNTER — Encounter: Payer: Self-pay | Admitting: Oncology

## 2021-06-04 ENCOUNTER — Encounter: Payer: Self-pay | Admitting: Surgery

## 2021-06-04 ENCOUNTER — Ambulatory Visit (INDEPENDENT_AMBULATORY_CARE_PROVIDER_SITE_OTHER): Payer: Self-pay | Admitting: Surgery

## 2021-06-04 ENCOUNTER — Other Ambulatory Visit: Payer: Self-pay

## 2021-06-04 VITALS — BP 131/84 | HR 83 | Temp 98.2°F | Ht 67.0 in | Wt 263.6 lb

## 2021-06-04 DIAGNOSIS — Z170 Estrogen receptor positive status [ER+]: Secondary | ICD-10-CM

## 2021-06-04 DIAGNOSIS — Z853 Personal history of malignant neoplasm of breast: Secondary | ICD-10-CM

## 2021-06-04 DIAGNOSIS — C50412 Malignant neoplasm of upper-outer quadrant of left female breast: Secondary | ICD-10-CM

## 2021-06-04 NOTE — Patient Instructions (Addendum)
Patient will be asked to return to the office in one year with a bilateral screening mammogram.  We will send you a letter about these appointments.   Be sure to get your Colonoscopy or Cologuard test done.   Continue self breast exams. Call office for any new breast issues or concerns.  Breast Self-Awareness Breast self-awareness means being familiar with how your breasts look and feel. It involves checking your breasts regularly and reporting any changes to your health care provider. Practicing breast self-awareness is important. Sometimes changes may not be harmful (are benign), but sometimes a change in your breasts can be a sign of a serious medical problem. It is important to learn how to do this procedure correctly so that you can catch problems early, when treatment is more likely to be successful. All women should practice breast self-awareness, including women who have had breast implants. What you need: A mirror. A well-lit room. How to do a breast self-exam A breast self-exam is one way to learn what is normal for your breasts and whether your breasts are changing. To do a breast self-exam: Look for changes  Remove all the clothing above your waist. Stand in front of a mirror in a room with good lighting. Put your hands on your hips. Push your hands firmly downward. Compare your breasts in the mirror. Look for differences between them (asymmetry), such as: Differences in shape. Differences in size. Puckers, dips, and bumps in one breast and not the other. Look at each breast for changes in the skin, such as: Redness. Scaly areas. Look for changes in your nipples, such as: Discharge. Bleeding. Dimpling. Redness. A change in position. Feel for changes Carefully feel your breasts for lumps and changes. It is best to do this while lying on your back on the floor, and again while sitting or standing in the tub or shower with soapy water on your skin. Feel each breast in the  following way: Place the arm on the side of the breast you are examining above your head. Feel your breast with the other hand. Start in the nipple area and make -inch (2 cm) overlapping circles to feel your breast. Use the pads of your three middle fingers to do this. Apply light pressure, then medium pressure, then firm pressure. The light pressure will allow you to feel the tissue closest to the skin. The medium pressure will allow you to feel the tissue that is a little deeper. The firm pressure will allow you to feel the tissue close to the ribs. Continue the overlapping circles, moving downward over the breast until you feel your ribs below your breast. Move one finger-width toward the center of the body. Continue to use the -inch (2 cm) overlapping circles to feel your breast as you move slowly up toward your collarbone. Continue the up-and-down exam using all three pressures until you reach your armpit.  Write down what you find Writing down what you find can help you remember what to discuss with your health care provider. Write down: What is normal for each breast. Any changes that you find in each breast, including: The kind of changes you find. Any pain or tenderness. Size and location of any lumps. Where you are in your menstrual cycle, if you are still menstruating. General tips and recommendations Examine your breasts every month. If you are breastfeeding, the best time to examine your breasts is after a feeding or after using a breast pump. If you menstruate, the best  time to examine your breasts is 5-7 days after your period. Breasts are generally lumpier during menstrual periods, and it may be more difficult to notice changes. With time and practice, you will become more familiar with the variations in your breasts and more comfortable with the exam. Contact a health care provider if you: See a change in the shape or size of your breasts or nipples. See a change in the skin  of your breast or nipples, such as a reddened or scaly area. Have unusual discharge from your nipples. Find a lump or thick area that was not there before. Have pain in your breasts. Have any concerns related to your breast health. Summary Breast self-awareness includes looking for physical changes in your breasts, as well as feeling for any changes within your breasts. Breast self-awareness should be performed in front of a mirror in a well-lit room. You should examine your breasts every month. If you menstruate, the best time to examine your breasts is 5-7 days after your menstrual period. Let your health care provider know of any changes you notice in your breasts, including changes in size, changes on the skin, pain or tenderness, or unusual fluid from your nipples. This information is not intended to replace advice given to you by your health care provider. Make sure you discuss any questions you have with your health care provider. Document Revised: 03/15/2018 Document Reviewed: 03/15/2018 Elsevier Patient Education  [place].

## 2021-06-05 ENCOUNTER — Encounter: Payer: Self-pay | Admitting: Surgery

## 2021-06-05 NOTE — Progress Notes (Signed)
Outpatient Surgical Follow Up  06/05/2021  Norma Austin is an 59 y.o. female.   Chief Complaint  Patient presents with   Follow-up    HPI: 60 year old female status post lumpectomy mammosite and sentinel node biopsy by Dr. Jamal Collin on August 2018.  ER, PR positive.    No new issues.  She had a  mammogram that I have personally reviewed showing no evidence of recurrence or other suspicious lesions /2022. On letrazole w/o any major side effects.  Past Medical History:  Diagnosis Date   Arthritis    Breast cancer ([place]) 2018   INVASIVE MAMMARY CARCINOMA, T1c, N0, 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   GERD (gastroesophageal reflux disease)    Indigestion    Osteopenia 12/26/2018   Personal history of radiation therapy 2018   F/U left breast cancer   Plantar fasciitis     Past Surgical History:  Procedure Laterality Date   BREAST BIOPSY Left 02/24/2017   INVASIVE MAMMARY CARCINOMA   BREAST LUMPECTOMY Left 2018   Invasive, F/U with radiation and Letrozole    BREAST LUMPECTOMY WITH SENTINEL LYMPH NODE BIOPSY Left 03/18/2017   Procedure: BREAST LUMPECTOMY WITH SENTINEL LYMPH NODE BX;  Surgeon: [name], MD;  Location: [facility] ORS;  Service: General;  Laterality: Left;   BREAST MAMMOSITE Left 03/2017   GANGLION CYST EXCISION  1990   MOUTH SURGERY     4 BOTTOM FRONT DENTAL IMPLANTS    Family History  Problem Relation Age of Onset   Breast cancer Mother 59   CVA Mother    Lung cancer Father    Thyroid cancer Cousin    Colon cancer Neg Hx     Social History:  reports that she quit smoking about 14 years ago. Her smoking use included cigarettes. She has a 25.00 pack-year smoking history. She has never used smokeless tobacco. She reports current alcohol use. She reports that she does not use drugs.  Allergies:  Allergies  Allergen Reactions   Adhesive [Tape]     WHELPS    Medications reviewed.    ROS Full ROS performed and is otherwise negative other than  what is stated in HPI   BP 131/84   Pulse 83   Temp 98.2 F (36.8 C)   Ht _0  (1.702 m)   Wt 263 lb 9.6 oz (119.6 kg)   LMP 03/19/2011 (Approximate) Comment: LMP 2011 or 2012  SpO2 94%   BMI 41.29 kg/m   Physical Exam Vitals and nursing note reviewed. Exam conducted with a chaperone present.  Constitutional:      General: She is not in acute distress.    Appearance: Normal appearance.  Eyes:     General: No scleral icterus.       Right eye: No discharge.        Left eye: No discharge.  Cardiovascular:     Rate and Rhythm: Normal rate and regular rhythm.     Heart sounds: No murmur heard. Pulmonary:     Effort: Pulmonary effort is normal. No respiratory distress.     Breath sounds: Normal breath sounds. No stridor.     Comments: BREAST: Left  lumpectomy and ax scar. No infection, no masses.. No LAD Abdominal:     General: There is no distension.     Palpations: Abdomen is soft. There is no mass.     Tenderness: There is no abdominal tenderness.     Hernia: No hernia is present.  Musculoskeletal:  Cervical back: Normal range of motion and neck supple. No rigidity or tenderness.  Skin:    Capillary Refill: Capillary refill takes less than 2 seconds.  Neurological:     General: No focal deficit present.     Mental Status: She is alert and oriented to person, place, and time.  Psychiatric:        Mood and Affect: Mood normal.        Behavior: Behavior normal.        Thought Content: Thought content normal.        Judgment: Judgment normal.    Assessment/Plan: 60 year old female with history of breast cancer on the left side now with a recent mammogram showing no evidence of malignancy and physical exam also benign.  We will see her back in 1 year with a repeat mammogram. Referal to GI for screening colonoscopy      Greater than 50% of the 30 minutes  visit was spent in counseling/coordination of care   Caroleen Hamman, MD [facility]

## 2021-06-09 ENCOUNTER — Encounter: Payer: Self-pay | Admitting: Oncology

## 2021-06-09 ENCOUNTER — Other Ambulatory Visit: Payer: Self-pay

## 2021-06-09 ENCOUNTER — Inpatient Hospital Stay: Payer: Disability Insurance

## 2021-06-09 ENCOUNTER — Inpatient Hospital Stay: Payer: Disability Insurance | Attending: Oncology | Admitting: Oncology

## 2021-06-09 VITALS — BP 130/92 | HR 72 | Temp 97.0°F | Resp 18 | Wt 265.3 lb

## 2021-06-09 DIAGNOSIS — C50412 Malignant neoplasm of upper-outer quadrant of left female breast: Secondary | ICD-10-CM

## 2021-06-09 DIAGNOSIS — M8580 Other specified disorders of bone density and structure, unspecified site: Secondary | ICD-10-CM

## 2021-06-09 DIAGNOSIS — Z87891 Personal history of nicotine dependence: Secondary | ICD-10-CM | POA: Insufficient documentation

## 2021-06-09 DIAGNOSIS — Z79811 Long term (current) use of aromatase inhibitors: Secondary | ICD-10-CM

## 2021-06-09 DIAGNOSIS — Z170 Estrogen receptor positive status [ER+]: Secondary | ICD-10-CM

## 2021-06-09 DIAGNOSIS — E559 Vitamin D deficiency, unspecified: Secondary | ICD-10-CM

## 2021-06-09 LAB — CBC WITH DIFFERENTIAL/PLATELET
Abs Immature Granulocytes: 0.01 10*3/uL (ref 0.00–0.07)
Basophils Absolute: 0.1 10*3/uL (ref 0.0–0.1)
Basophils Relative: 1 %
Eosinophils Absolute: 0.2 10*3/uL (ref 0.0–0.5)
Eosinophils Relative: 4 %
HCT: 40.1 % (ref 36.0–46.0)
Hemoglobin: 13.9 g/dL (ref 12.0–15.0)
Immature Granulocytes: 0 %
Lymphocytes Relative: 25 %
Lymphs Abs: 1.4 10*3/uL (ref 0.7–4.0)
MCH: 30.4 pg (ref 26.0–34.0)
MCHC: 34.7 g/dL (ref 30.0–36.0)
MCV: 87.7 fL (ref 80.0–100.0)
Monocytes Absolute: 0.4 10*3/uL (ref 0.1–1.0)
Monocytes Relative: 7 %
Neutro Abs: 3.7 10*3/uL (ref 1.7–7.7)
Neutrophils Relative %: 63 %
Platelets: 184 10*3/uL (ref 150–400)
RBC: 4.57 MIL/uL (ref 3.87–5.11)
RDW: 13.1 % (ref 11.5–15.5)
WBC: 5.8 10*3/uL (ref 4.0–10.5)
nRBC: 0 % (ref 0.0–0.2)

## 2021-06-09 LAB — COMPREHENSIVE METABOLIC PANEL
ALT: 42 U/L (ref 0–44)
AST: 31 U/L (ref 15–41)
Albumin: 3.9 g/dL (ref 3.5–5.0)
Alkaline Phosphatase: 38 U/L (ref 38–126)
Anion gap: 7 (ref 5–15)
BUN: 14 mg/dL (ref 8–23)
CO2: 25 mmol/L (ref 22–32)
Calcium: 8.7 mg/dL — ABNORMAL LOW (ref 8.9–10.3)
Chloride: 104 mmol/L (ref 98–111)
Creatinine, Ser: 0.83 mg/dL (ref 0.44–1.00)
GFR, Estimated: >60 mL/min (ref >60)
Glucose, Bld: 125 mg/dL — ABNORMAL HIGH (ref 70–99)
Potassium: 4.1 mmol/L (ref 3.5–5.1)
Sodium: 136 mmol/L (ref 135–145)
Total Bilirubin: 1 mg/dL (ref 0.3–1.2)
Total Protein: 7.4 g/dL (ref 6.5–8.1)

## 2021-06-09 NOTE — Progress Notes (Signed)
Pt here for follow up. No new concerns voiced. No new breast problems  

## 2021-06-09 NOTE — Progress Notes (Signed)
[facility]  [name], [name] as PCP - General (Physician Assistant) [name], MD as Referring Physician (Family Medicine) [name], MD (General Surgery) Earlie Server, MD as Consulting Physician (Hematology and Oncology) Rico Junker, RN as Registered Nurse Theodore Demark, RN as Registered Nurse  REASON FOR VISIT Follow up for treatment of Stage IA  ER+ PR+ breast cancer  HISTORY OF PRESENTING ILLNESS: 1 Norma Austin 60 y.o. female with PMH listed as below is referred by Dr.Cook to cancer center for evaluation and management of newly diagnosed breast caner.  Patient had routine mammogram screening which showed left breast mass. This was followed by a diagnostic Mammogram and ultrasound which showed an hypoechoic mass measuring 12 x 7 x 10 mm at 1:30, 7 cm from the nipple, corresponding to thespiculated mass seen mammographically. There is an adjacent near anechoic mass at 1 o'clock, 6 cm from the nipple measuring 6 x 4 x 4 mm. 1:30 mass was biopsy and showed invasive carcinoma of breast, unspecified type, no LVI, No DCIS. The 1 o'clock mass disappeared after biopsy of the 1:30 mass. It maybe a cyst which was poked by the needle for anesthesia.   2 Pathology: 02/24/2017 US guided breast mass biopsy showed invasive mammary carcinoma, grade 1, ER 90% PR90%, HER2 Equivocal by IHC, negative by FISH. S/p left breast lumpectomy and sentinel lymph node biopsy on 03/18/2017. Pathology showed 49m invasive mammary carcinoma of no specific type, margins are all negative, and all 3 sentinel lymph nodes were negative. ER/PR 90%, HER2 FISH negative. Mammaprint showed low risk.  3  lumpectomy followed by MammoSite radiation. Mammaprint indicates low risk. Adjuvant chemotherapy is not offered.   Interval History:  Patient with above oncology history reviewed by me present for follow-up management of stage IA ER/PR positive HER 2 negative breast  cancer. She takes letrozole 2.5 mg daily with manageable chronic arthralgia.  She has no new complaints.   Review of Systems  Constitutional:  Negative for chills, fever, malaise/fatigue and weight loss.  HENT:  Negative for sore throat.   Eyes:  Negative for redness.  Respiratory:  Negative for cough, shortness of breath and wheezing.   Cardiovascular:  Negative for chest pain, palpitations and leg swelling.  Gastrointestinal:  Negative for abdominal pain, blood in stool, nausea and vomiting.  Genitourinary:  Negative for dysuria.  Musculoskeletal:  Positive for joint pain. Negative for myalgias.  Skin:  Negative for rash.  Neurological:  Negative for dizziness, tingling and tremors.  Endo/Heme/Allergies:  Does not bruise/bleed easily.  Psychiatric/Behavioral:  Negative for hallucinations.    MEDICAL HISTORY: Past Medical History:  Diagnosis Date   Arthritis    Breast cancer ([place] 2018   INVASIVE MAMMARY CARCINOMA, T1c, N0, 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   GERD (gastroesophageal reflux disease)    Indigestion    Osteopenia 12/26/2018   Personal history of radiation therapy 2018   F/U left breast cancer   Plantar fasciitis     SURGICAL HISTORY: Past Surgical History:  Procedure Laterality Date   BREAST BIOPSY Left 02/24/2017   INVASIVE MAMMARY CARCINOMA   BREAST LUMPECTOMY Left 2018   Invasive, F/U with radiation and Letrozole    BREAST LUMPECTOMY WITH SENTINEL LYMPH NODE BIOPSY Left 03/18/2017   Procedure: BREAST LUMPECTOMY WITH SENTINEL LYMPH NODE BX;  Surgeon: SChristene Lye MD;  Location: [facility] ORS;  Service: General;  Laterality: Left;   BREAST MAMMOSITE Left 03/2017  GANGLION CYST EXCISION  1990   MOUTH SURGERY     4 BOTTOM FRONT DENTAL IMPLANTS    SOCIAL HISTORY: Social History   Socioeconomic History   Marital status: Married    Spouse name: Not on file   Number of children: Not on file   Years of education: Not on file   Highest education  level: Not on file  Occupational History   Not on file  Tobacco Use   Smoking status: Former    Packs/day: 1.00    Years: 25.00    Pack years: 25.00    Types: Cigarettes    Quit date: 03/12/2007    Years since quitting: 14.2   Smokeless tobacco: Never  Vaping Use   Vaping Use: Never used  Substance and Sexual Activity   Alcohol use: Yes    Alcohol/week: 0.0 standard drinks    Comment: occasional   Drug use: No   Sexual activity: Not on file  Other Topics Concern   Not on file  Social History Narrative   Not on file   Social Determinants of Health   Financial Resource Strain: Not on file  Food Insecurity: Not on file  Transportation Needs: Not on file  Physical Activity: Not on file  Stress: Not on file  Social Connections: Not on file  Intimate Partner Violence: Not on file    FAMILY HISTORY Family History  Problem Relation Age of Onset   Breast cancer Mother 39   CVA Mother    Lung cancer Father    Thyroid cancer Cousin    Colon cancer Neg Hx     ALLERGIES:  is allergic to adhesive [tape].  MEDICATIONS:  Current Outpatient Medications  Medication Sig Dispense Refill   acetaminophen (TYLENOL) 500 MG tablet Take 1,000 mg by mouth every 6 (six) hours as needed (for pain.).     Cholecalciferol (VITAMIN D) 50 MCG (2000 UT) tablet Take 1 tablet (2,000 Units total) by mouth daily. 90 tablet 1   diclofenac (VOLTAREN) 50 MG EC tablet Take 50 mg by mouth 2 (two) times daily as needed (for plantar fasciitis/arthritis.).      letrozole (FEMARA) 2.5 MG tablet Take 1 tablet (2.5 mg total) by mouth daily. 90 tablet 1   loratadine (CLARITIN) 10 MG tablet Take 10 mg by mouth daily.     rosuvastatin (CRESTOR) 10 MG tablet Take 10 mg by mouth daily.     No current facility-administered medications for this visit.    PHYSICAL EXAMINATION:  ECOG PERFORMANCE STATUS: 0 - Asymptomatic   Vitals:   06/09/21 1000  BP: (!) 130/92  Pulse: 72  Resp: 18  Temp: (!) 97 F (36.1  C)    Filed Weights   06/09/21 1000  Weight: 265 lb 4.8 oz (120.3 kg)     Physical Exam Constitutional:      General: She is not in acute distress.    Appearance: She is not diaphoretic.  HENT:     Head: Normocephalic and atraumatic.     Nose: Nose normal.     Mouth/Throat:     Pharynx: No oropharyngeal exudate.  Eyes:     General: No scleral icterus.       Left eye: No discharge.     Pupils: Pupils are equal, round, and reactive to light.  Neck:     Vascular: No JVD.  Cardiovascular:     Rate and Rhythm: Normal rate and regular rhythm.     Heart sounds: Normal heart sounds.  No murmur heard. Pulmonary:     Effort: Pulmonary effort is normal. No respiratory distress.     Breath sounds: Normal breath sounds. No wheezing or rales.  Chest:     Chest wall: No tenderness.  Abdominal:     General: There is no distension.     Palpations: Abdomen is soft. There is no mass.     Tenderness: There is no abdominal tenderness. There is no rebound.  Musculoskeletal:        General: No tenderness. Normal range of motion.     Cervical back: Normal range of motion and neck supple.  Lymphadenopathy:     Cervical: No cervical adenopathy.  Skin:    General: Skin is warm and dry.     Findings: No erythema or rash.  Neurological:     Mental Status: She is alert and oriented to person, place, and time.     Motor: No abnormal muscle tone.  Psychiatric:        Mood and Affect: Affect normal.   RADIOGRAPHIC STUDIES: I have personally reviewed the radiological images as listed and agreed with the findings in the report. DG Shoulder Left  Result Date: 05/04/2021 CLINICAL DATA:  Arthritis.  Left shoulder pain. EXAM: LEFT SHOULDER - 2+ VIEW COMPARISON:  None. FINDINGS: There is no evidence of fracture or dislocation. Mild acromioclavicular degenerative change with inferior spurs. Glenohumeral joint space is preserved, tiny inferior glenoid spur. No erosion, avascular necrosis, or focal bone  abnormality. Soft tissues are unremarkable. IMPRESSION: Mild acromioclavicular and minimal glenohumeral osteoarthritis. Electronically Signed   By: Keith Rake M.D.   On: 05/04/2021 02:16   DG Knee Complete 4 Views Left  Result Date: 05/04/2021 CLINICAL DATA:  Arthritis, bilateral knee pain. EXAM: LEFT KNEE - COMPLETE 4+ VIEW COMPARISON:  None. FINDINGS: Patellofemoral joint space narrowing with subchondral cystic change. Cyst in the central upper tibia with thin peripheral sclerosis may represent a large subchondral cyst or geode. There is spurring of the tibial spines. No fracture, erosion, or bony destruction. No significant joint effusion. IMPRESSION: Moderate patellofemoral osteoarthritis with joint space narrowing and subchondral cystic change. Cystic change in the central upper tibia may represent a large subchondral cyst or geode. Electronically Signed   By: Keith Rake M.D.   On: 05/04/2021 02:20   DG Knee Complete 4 Views Right  Result Date: 05/04/2021 CLINICAL DATA:  Arthritis.  Bilateral knee pain. EXAM: RIGHT KNEE - COMPLETE 4+ VIEW COMPARISON:  None. FINDINGS: There is lateral tibiofemoral as well as patellofemoral joint space narrowing. Moderate tricompartmental peripheral spurs. Mild spurring of the tibial spines. No fracture, erosion, or focal bone abnormality. There is a small knee joint effusion. IMPRESSION: Moderate tricompartmental osteoarthritis with small joint effusion. Electronically Signed   By: Keith Rake M.D.   On: 05/04/2021 02:17   MS DIGITAL SCREENING TOMO BILATERAL  Result Date: 04/16/2021 CLINICAL DATA:  Screening. EXAM: DIGITAL SCREENING BILATERAL MAMMOGRAM WITH TOMOSYNTHESIS AND CAD TECHNIQUE: Bilateral screening digital craniocaudal and mediolateral oblique mammograms were obtained. Bilateral screening digital breast tomosynthesis was performed. The images were evaluated with computer-aided detection. COMPARISON:  Previous exam(s). ACR Breast Density  Category b: There are scattered areas of fibroglandular density. FINDINGS: There are no findings suspicious for malignancy. IMPRESSION: No mammographic evidence of malignancy. A result letter of this screening mammogram will be mailed directly to the patient. RECOMMENDATION: Screening mammogram in one year. (Code:SM-B-01Y) BI-RADS CATEGORY  1: Negative. Electronically Signed   By: Franki Cabot M.D.   On:  04/16/2021 09:04    ASSESSMENT/PLAN Cancer Staging Malignant neoplasm of upper-outer quadrant of left breast in female, estrogen receptor positive ([place]) Staging form: Breast, AJCC 8th Edition - Clinical stage from 03/03/2017: Stage IA (cT1c, cN0, cM0, G1, ER+, PR+, HER2: Equivocal) - Signed by Earlie Server, MD on 03/03/2017 Neoadjuvant therapy: No Method of lymph node assessment: Clinical Histologic grading system: 3 grade system Laterality: Left Tumor size (mm): 12 Lymph-vascular invasion (LVI): LVI not present (absent)/not identified - Pathologic stage from 04/30/2017: pT1c, pN0, cM0, ER+, PR+, HER2- - Signed by Earlie Server, MD on 04/30/2017 HER2-FISH interpretation: Negative  1. Malignant neoplasm of upper-outer quadrant of left breast in female, estrogen receptor positive ([place])   2. Osteopenia, unspecified location   3. Vitamin D deficiency   4. Aromatase inhibitor use   #Stage IA Breast cancer,-2018 Labs reviewed and discussed with patient. Clinically she is doing well. Continue adjuvant letrozole.  Refills were sent. Patient gets mammograms through Dr. Corlis Leak office. 04/15/2021, bilateral screening mammogram showed no mammographic evidence of malignancy.   # Osteopenia, continue calcium and vitamin d supplementation.  # Patient has been on Zometa treatments every 6 months.  She will get next treatment in December 2022.  We will schedule.  She will also get BMP prior to getting Zometa treatment. #Vitamin D deficiency,  Continue vitamin D supplementation.   Follow up in  6 months.  All  questions were answered. The patient knows to call the clinic with any problems, questions or concerns.   Earlie Server MD PhD Hematolgy Oncology [facility] at [facility] 06/09/21

## 2021-06-22 NOTE — Progress Notes (Signed)
Letter mailed from [facility] to notify of normal mammogram results.  Patient to return in one year for annual screening.  Copy to [facility]. 

## 2021-07-18 ENCOUNTER — Other Ambulatory Visit: Payer: Self-pay

## 2021-07-18 ENCOUNTER — Inpatient Hospital Stay: Payer: Disability Insurance | Attending: Oncology

## 2021-07-18 ENCOUNTER — Inpatient Hospital Stay: Payer: Self-pay

## 2021-07-18 VITALS — BP 154/79 | HR 78 | Temp 98.1°F | Resp 18

## 2021-07-18 DIAGNOSIS — C50412 Malignant neoplasm of upper-outer quadrant of left female breast: Secondary | ICD-10-CM

## 2021-07-18 DIAGNOSIS — Z170 Estrogen receptor positive status [ER+]: Secondary | ICD-10-CM

## 2021-07-18 DIAGNOSIS — M8580 Other specified disorders of bone density and structure, unspecified site: Secondary | ICD-10-CM | POA: Insufficient documentation

## 2021-07-18 LAB — BASIC METABOLIC PANEL
Anion gap: 8 (ref 5–15)
BUN: 12 mg/dL (ref 8–23)
CO2: 27 mmol/L (ref 22–32)
Calcium: 9.3 mg/dL (ref 8.9–10.3)
Chloride: 102 mmol/L (ref 98–111)
Creatinine, Ser: 0.7 mg/dL (ref 0.44–1.00)
GFR, Estimated: >60 mL/min (ref >60)
Glucose, Bld: 88 mg/dL (ref 70–99)
Potassium: 4 mmol/L (ref 3.5–5.1)
Sodium: 137 mmol/L (ref 135–145)

## 2021-07-18 MED ORDER — ZOLEDRONIC ACID 4 MG/100ML IV SOLN
4.0000 mg | Freq: Once | INTRAVENOUS | Status: AC
  Administered 2021-07-18: 4 mg via INTRAVENOUS
  Filled 2021-07-18: qty 100

## 2021-07-18 MED ORDER — SODIUM CHLORIDE 0.9 % IV SOLN
Freq: Once | INTRAVENOUS | Status: AC
  Filled 2021-07-18: qty 250

## 2021-07-18 NOTE — Patient Instructions (Signed)

## 2021-11-26 IMAGING — CR DG KNEE COMPLETE 4+V*L*
4 series · 4 of 4 positions shown · non-contrast
Comparison: None.

CLINICAL DATA: Arthritis, bilateral knee pain.

EXAM:
LEFT KNEE - COMPLETE 4+ VIEW

[knee ap]
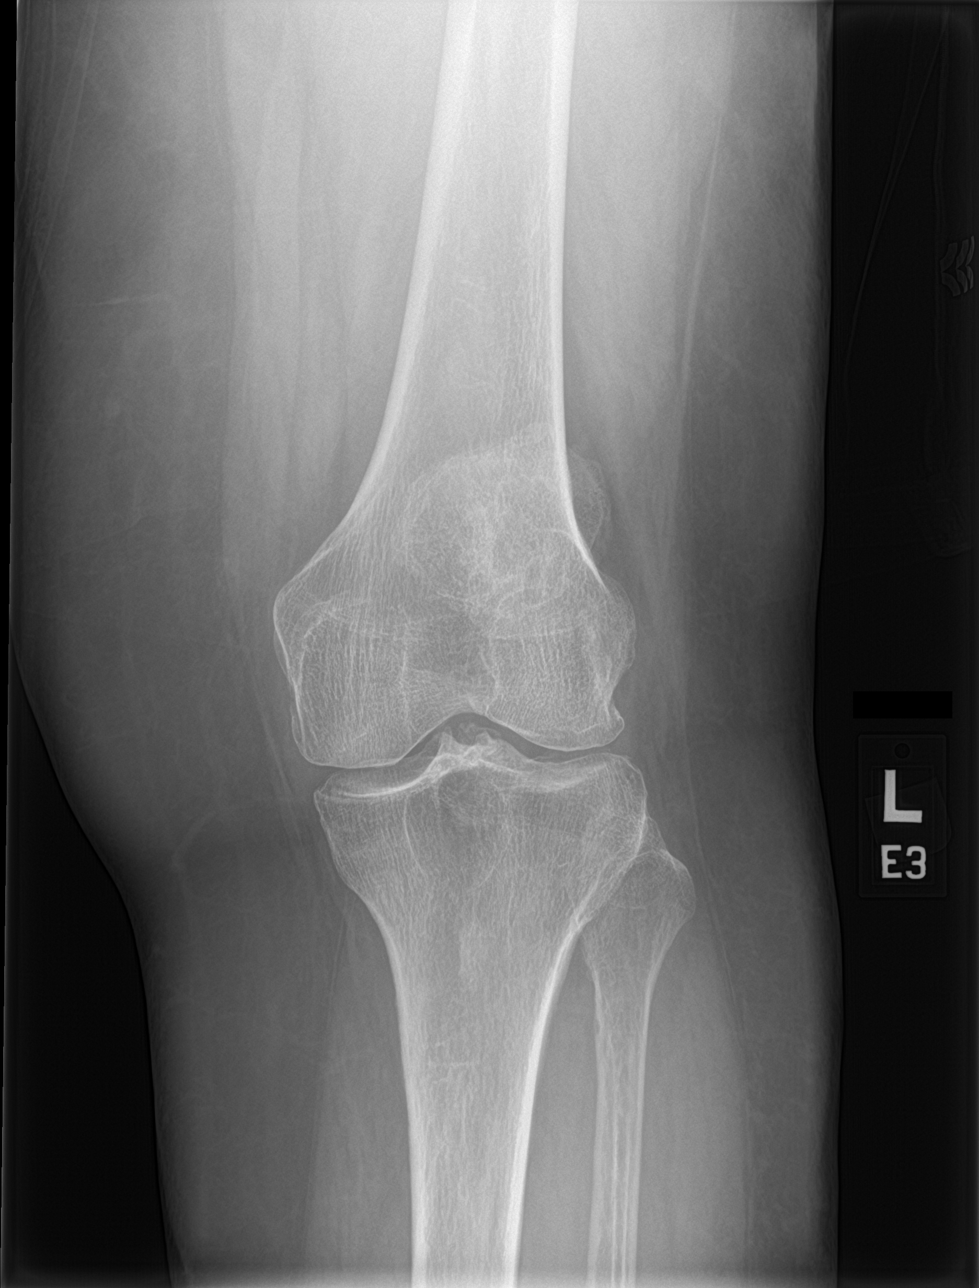

[knee obl (1 of 2)]
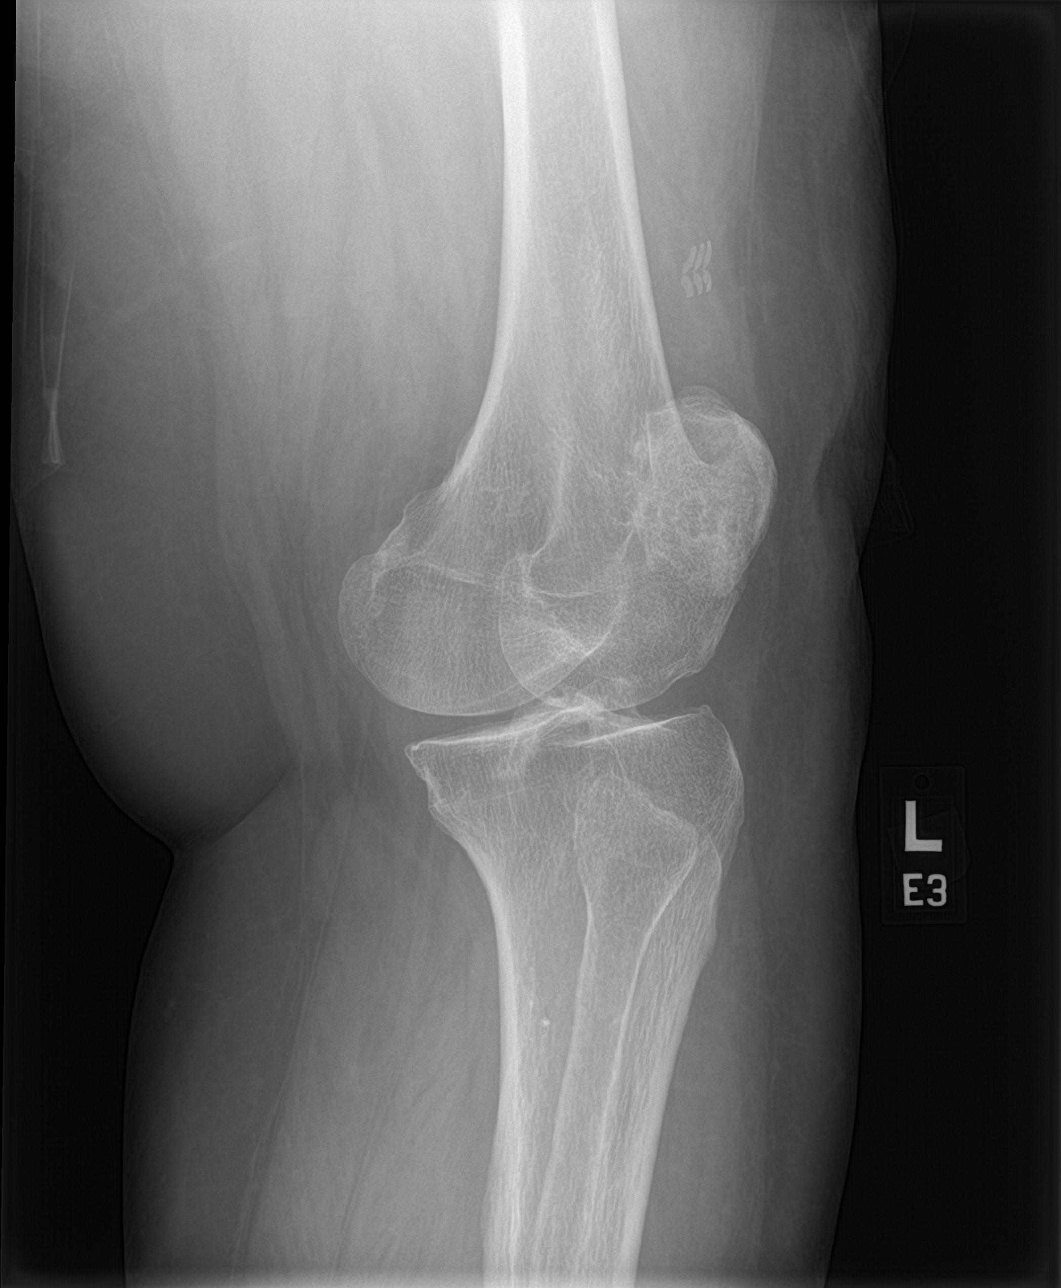

[knee obl (2 of 2)]
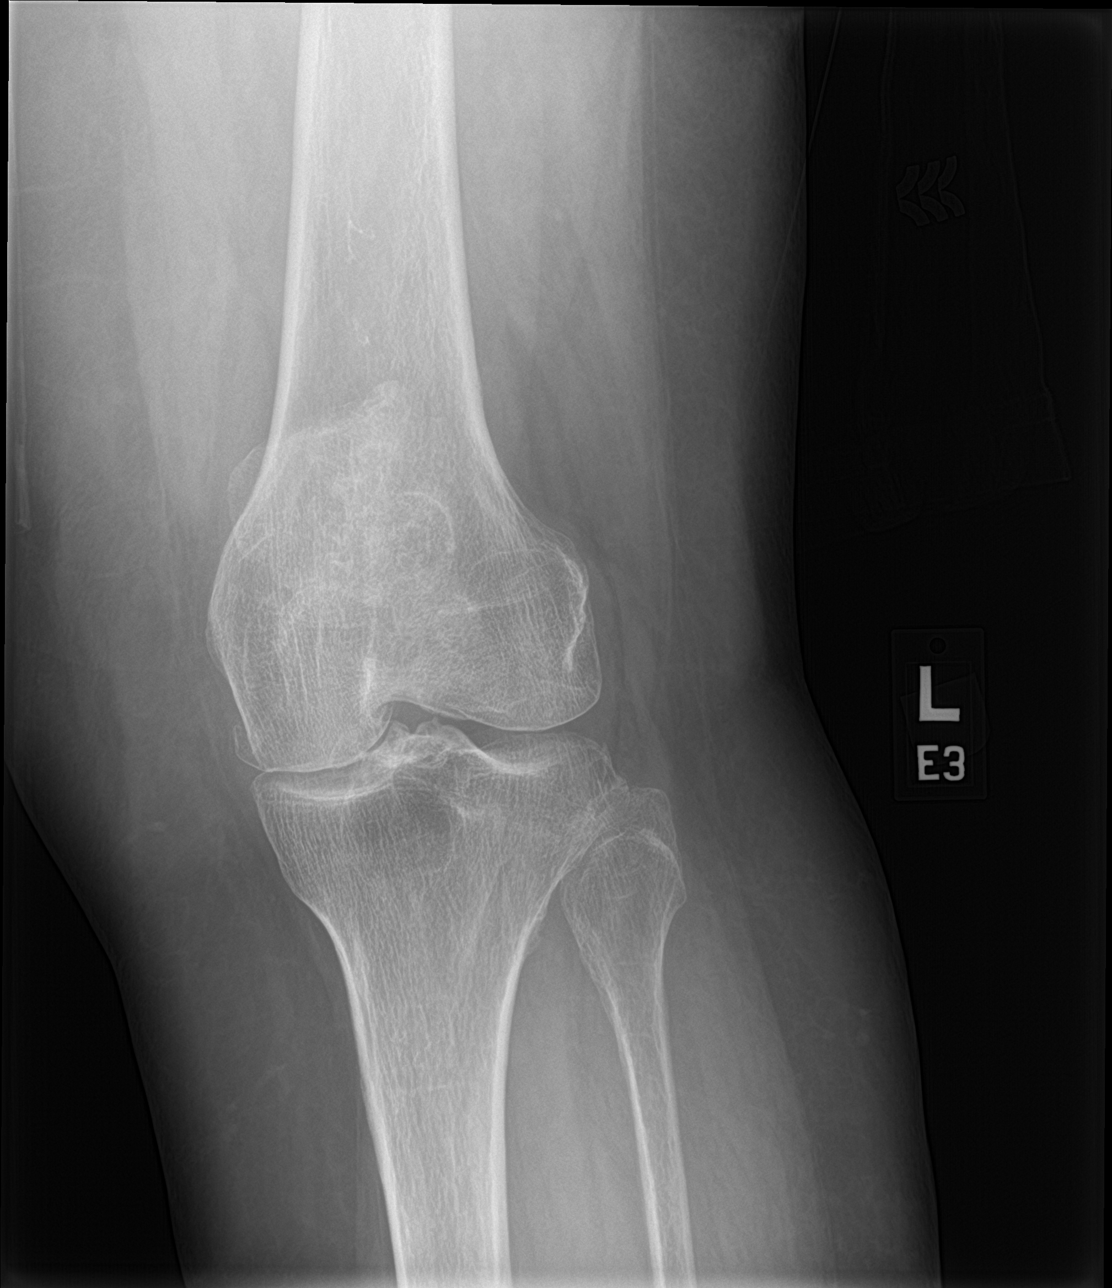

[knee lat]
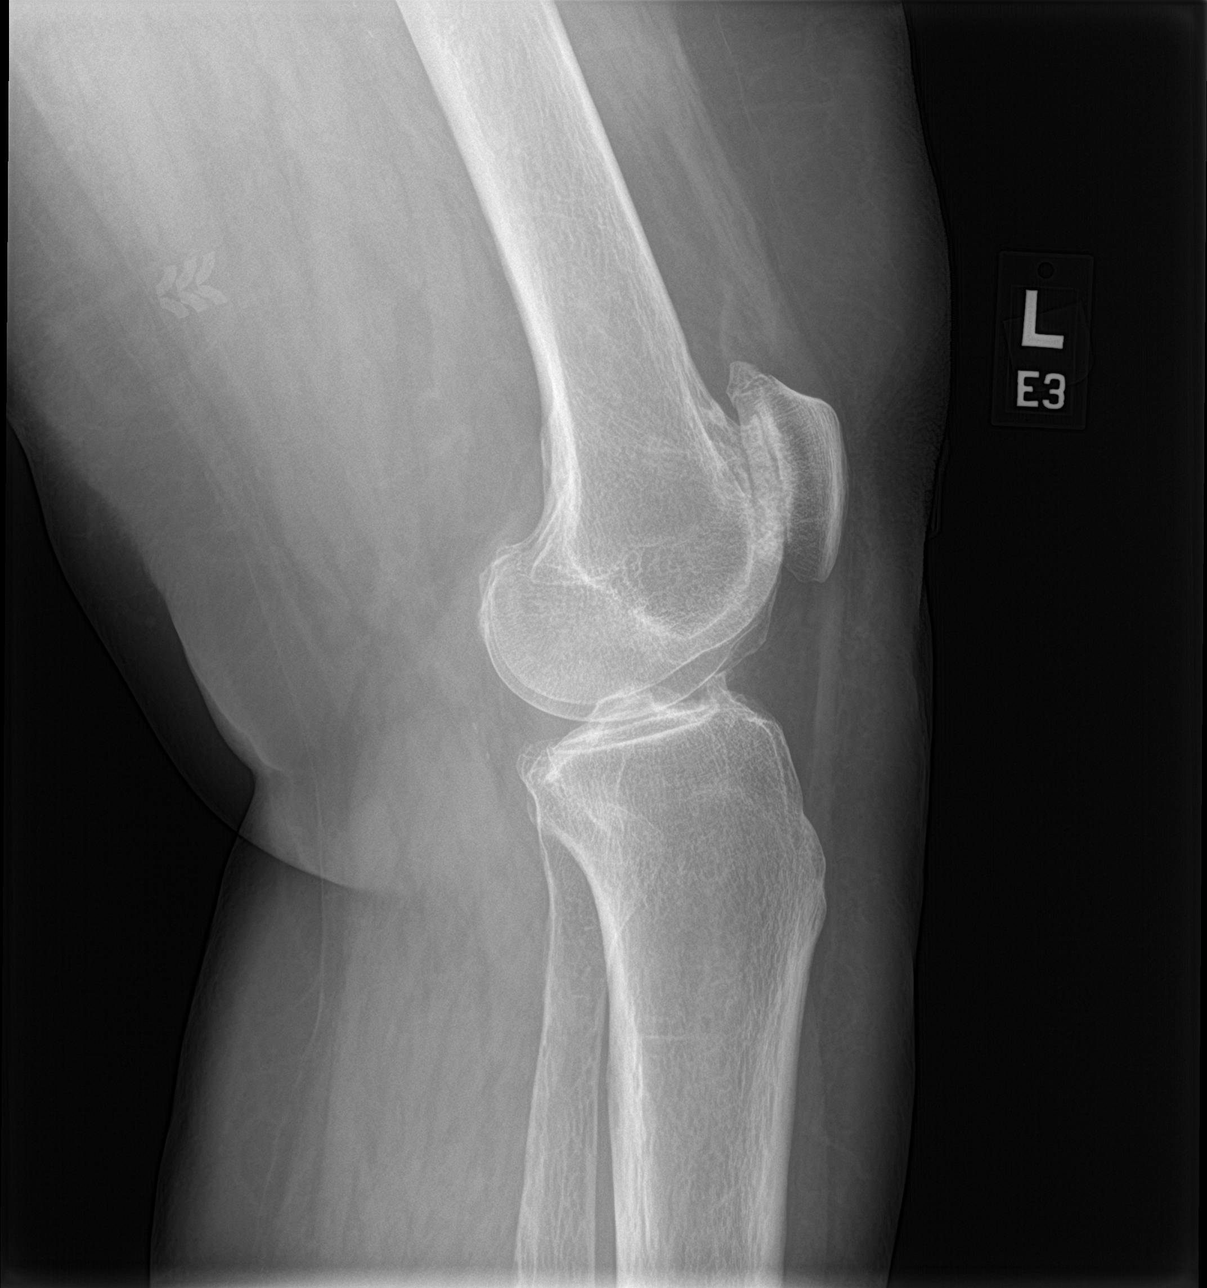

[4 of 4 positions shown; findings below may reference images not displayed]

FINDINGS: Patellofemoral joint space narrowing with subchondral cystic change.
Cyst in the central upper tibia with thin peripheral sclerosis may
represent a large subchondral cyst or geode. There is spurring of
the tibial spines. No fracture, erosion, or bony destruction. No
significant joint effusion.
IMPRESSION: Moderate patellofemoral osteoarthritis with joint space narrowing
and subchondral cystic change. Cystic change in the central upper
tibia may represent a large subchondral cyst or geode.

## 2021-12-09 ENCOUNTER — Inpatient Hospital Stay (HOSPITAL_BASED_OUTPATIENT_CLINIC_OR_DEPARTMENT_OTHER): Payer: BLUE CROSS/BLUE SHIELD | Admitting: Oncology

## 2021-12-09 ENCOUNTER — Inpatient Hospital Stay: Payer: BLUE CROSS/BLUE SHIELD | Attending: Oncology

## 2021-12-09 ENCOUNTER — Encounter: Payer: Self-pay | Admitting: Oncology

## 2021-12-09 VITALS — BP 143/82 | HR 68 | Temp 97.8°F | Ht 67.0 in | Wt 248.0 lb

## 2021-12-09 DIAGNOSIS — C50412 Malignant neoplasm of upper-outer quadrant of left female breast: Secondary | ICD-10-CM | POA: Diagnosis not present

## 2021-12-09 DIAGNOSIS — Z79899 Other long term (current) drug therapy: Secondary | ICD-10-CM | POA: Diagnosis not present

## 2021-12-09 DIAGNOSIS — Z79811 Long term (current) use of aromatase inhibitors: Secondary | ICD-10-CM | POA: Insufficient documentation

## 2021-12-09 DIAGNOSIS — Z170 Estrogen receptor positive status [ER+]: Secondary | ICD-10-CM

## 2021-12-09 DIAGNOSIS — E559 Vitamin D deficiency, unspecified: Secondary | ICD-10-CM | POA: Diagnosis not present

## 2021-12-09 DIAGNOSIS — Z87891 Personal history of nicotine dependence: Secondary | ICD-10-CM | POA: Insufficient documentation

## 2021-12-09 DIAGNOSIS — M8580 Other specified disorders of bone density and structure, unspecified site: Secondary | ICD-10-CM | POA: Diagnosis not present

## 2021-12-09 LAB — CBC WITH DIFFERENTIAL/PLATELET
Abs Immature Granulocytes: 0.01 10*3/uL (ref 0.00–0.07)
Basophils Absolute: 0.1 10*3/uL (ref 0.0–0.1)
Basophils Relative: 1 %
Eosinophils Absolute: 0.1 10*3/uL (ref 0.0–0.5)
Eosinophils Relative: 2 %
HCT: 44 % (ref 36.0–46.0)
Hemoglobin: 15.1 g/dL — ABNORMAL HIGH (ref 12.0–15.0)
Immature Granulocytes: 0 %
Lymphocytes Relative: 22 %
Lymphs Abs: 1.5 10*3/uL (ref 0.7–4.0)
MCH: 29.5 pg (ref 26.0–34.0)
MCHC: 34.3 g/dL (ref 30.0–36.0)
MCV: 85.9 fL (ref 80.0–100.0)
Monocytes Absolute: 0.4 10*3/uL (ref 0.1–1.0)
Monocytes Relative: 7 %
Neutro Abs: 4.4 10*3/uL (ref 1.7–7.7)
Neutrophils Relative %: 68 %
Platelets: 205 10*3/uL (ref 150–400)
RBC: 5.12 MIL/uL — ABNORMAL HIGH (ref 3.87–5.11)
RDW: 13.2 % (ref 11.5–15.5)
WBC: 6.5 10*3/uL (ref 4.0–10.5)
nRBC: 0 % (ref 0.0–0.2)

## 2021-12-09 LAB — COMPREHENSIVE METABOLIC PANEL
ALT: 29 U/L (ref 0–44)
AST: 22 U/L (ref 15–41)
Albumin: 4.2 g/dL (ref 3.5–5.0)
Alkaline Phosphatase: 42 U/L (ref 38–126)
Anion gap: 8 (ref 5–15)
BUN: 12 mg/dL (ref 8–23)
CO2: 25 mmol/L (ref 22–32)
Calcium: 9.4 mg/dL (ref 8.9–10.3)
Chloride: 105 mmol/L (ref 98–111)
Creatinine, Ser: 0.75 mg/dL (ref 0.44–1.00)
GFR, Estimated: >60 mL/min (ref >60)
Glucose, Bld: 114 mg/dL — ABNORMAL HIGH (ref 70–99)
Potassium: 4.2 mmol/L (ref 3.5–5.1)
Sodium: 138 mmol/L (ref 135–145)
Total Bilirubin: 0.9 mg/dL (ref 0.3–1.2)
Total Protein: 8.1 g/dL (ref 6.5–8.1)

## 2021-12-09 NOTE — Progress Notes (Signed)
[facility]  ?Patient Care Team: ?Renette Butters as PCP - General (Physician Assistant) ?[name], MD as Referring Physician (Family Medicine) ?[name], MD (General Surgery) ?Earlie Server, MD as Consulting Physician (Hematology and Oncology) ?Rico Junker, RN as Registered Nurse ?Theodore Demark, RN as Registered Nurse ? ?REASON FOR VISIT ?Follow up for treatment of Stage IA  ER+ PR+ breast cancer ? ?HISTORY OF PRESENTING ILLNESS: ?1 Norma Austin 61 y.o. female with PMH listed as below is referred by Dr.Cook to cancer center for evaluation and management of newly diagnosed breast caner.  ?Patient had routine mammogram screening which showed left breast mass. This was followed by a diagnostic Mammogram and ultrasound which showed an hypoechoic mass measuring 12 x 7 x 10 mm at 1:30, 7 cm from the nipple, corresponding to thespiculated mass seen mammographically. There is an adjacent near anechoic mass at 1 o'clock, 6 cm from the nipple measuring 6 x 4 x 4 mm. 1:30 mass was biopsy and showed invasive carcinoma of breast, unspecified type, no LVI, No DCIS. The 1 o'clock mass disappeared after biopsy of the 1:30 mass. It maybe a cyst which was poked by the needle for anesthesia.  ? ?2 Pathology: 02/24/2017 US guided breast mass biopsy showed invasive mammary carcinoma, grade 1, ER 90% PR90%, HER2 Equivocal by IHC, negative by FISH. ?S/p left breast lumpectomy and sentinel lymph node biopsy on 03/18/2017. Pathology showed 3mm invasive mammary carcinoma of no specific type, margins are all negative, and all 3 sentinel lymph nodes were negative. ER/PR 90%, HER2 FISH negative. Mammaprint showed low risk. ? ?3  lumpectomy followed by MammoSite radiation. Mammaprint indicates low risk. Adjuvant chemotherapy is not offered.  ? ?Interval History:  ?Patient with above oncology history reviewed by me present for follow-up management of stage IA ER/PR positive HER 2 negative breast  cancer. ?She takes letrozole 2.5 mg daily with manageable side effects.  Overall she tolerates well.  Today she has no new complaints. ? ? ?Review of Systems  ?Constitutional:  Negative for chills, fever, malaise/fatigue and weight loss.  ?HENT:  Negative for sore throat.   ?Eyes:  Negative for redness.  ?Respiratory:  Negative for cough, shortness of breath and wheezing.   ?Cardiovascular:  Negative for chest pain, palpitations and leg swelling.  ?Gastrointestinal:  Negative for abdominal pain, blood in stool, nausea and vomiting.  ?Genitourinary:  Negative for dysuria.  ?Musculoskeletal:  Positive for joint pain. Negative for myalgias.  ?Skin:  Negative for rash.  ?Neurological:  Negative for dizziness, tingling and tremors.  ?Endo/Heme/Allergies:  Does not bruise/bleed easily.  ?Psychiatric/Behavioral:  Negative for hallucinations.   ? ?MEDICAL HISTORY: ?Past Medical History:  ?Diagnosis Date  ? Arthritis   ? Breast cancer ([place]) 2018  ? INVASIVE MAMMARY CARCINOMA, T1c, N0, 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  ? GERD (gastroesophageal reflux disease)   ? Indigestion   ? Osteopenia 12/26/2018  ? Personal history of radiation therapy 2018  ? F/U left breast cancer  ? Plantar fasciitis   ? ? ?SURGICAL HISTORY: ?Past Surgical History:  ?Procedure Laterality Date  ? BREAST BIOPSY Left 02/24/2017  ? INVASIVE MAMMARY CARCINOMA  ? BREAST LUMPECTOMY Left 2018  ? Invasive, F/U with radiation and Letrozole   ? BREAST LUMPECTOMY WITH SENTINEL LYMPH NODE BIOPSY Left 03/18/2017  ? Procedure: BREAST LUMPECTOMY WITH SENTINEL LYMPH NODE BX;  Surgeon: [name], MD;  Location: [facility] ORS;  Service: General;  Laterality: Left;  ?  BREAST MAMMOSITE Left 03/2017  ? GANGLION CYST EXCISION  1990  ? MOUTH SURGERY    ? 4 BOTTOM FRONT DENTAL IMPLANTS  ? ? ?SOCIAL HISTORY: ?Social History  ? ?Socioeconomic History  ? Marital status: Married  ?  Spouse name: Not on file  ? Number of children: Not on file  ? Years of education: Not  on file  ? Highest education level: Not on file  ?Occupational History  ? Not on file  ?Tobacco Use  ? Smoking status: Former  ?  Packs/day: 1.00  ?  Years: 25.00  ?  Pack years: 25.00  ?  Types: Cigarettes  ?  Quit date: 03/12/2007  ?  Years since quitting: 14.7  ? Smokeless tobacco: Never  ?Vaping Use  ? Vaping Use: Never used  ?Substance and Sexual Activity  ? Alcohol use: Yes  ?  Alcohol/week: 0.0 standard drinks  ?  Comment: occasional  ? Drug use: No  ? Sexual activity: Not on file  ?Other Topics Concern  ? Not on file  ?Social History Narrative  ? Not on file  ? ?Social Determinants of Health  ? ?Financial Resource Strain: Not on file  ?Food Insecurity: Not on file  ?Transportation Needs: Not on file  ?Physical Activity: Not on file  ?Stress: Not on file  ?Social Connections: Not on file  ?Intimate Partner Violence: Not on file  ? ? ?FAMILY HISTORY ?Family History  ?Problem Relation Age of Onset  ? Breast cancer Mother 3  ? CVA Mother   ? Lung cancer Father   ? Thyroid cancer Cousin   ? Colon cancer Neg Hx   ? ? ?ALLERGIES:  is allergic to adhesive [tape]. ? ?MEDICATIONS:  ?Current Outpatient Medications  ?Medication Sig Dispense Refill  ? acetaminophen (TYLENOL) 500 MG tablet Take 1,000 mg by mouth every 6 (six) hours as needed (for pain.).    ? celecoxib (CELEBREX) 100 MG capsule Take 100 mg by mouth 2 (two) times daily.    ? Cholecalciferol (VITAMIN D) 50 MCG (2000 UT) tablet Take 1 tablet (2,000 Units total) by mouth daily. 90 tablet 1  ? diclofenac (VOLTAREN) 50 MG EC tablet Take 50 mg by mouth 2 (two) times daily as needed (for plantar fasciitis/arthritis.).     ? letrozole (FEMARA) 2.5 MG tablet Take 1 tablet (2.5 mg total) by mouth daily. 90 tablet 1  ? loratadine (CLARITIN) 10 MG tablet Take 10 mg by mouth daily.    ? rosuvastatin (CRESTOR) 10 MG tablet Take 10 mg by mouth daily.    ? ?No current facility-administered medications for this visit.  ? ? ?PHYSICAL EXAMINATION: ? ?ECOG PERFORMANCE STATUS:  0 - Asymptomatic ? ? ?Vitals:  ? 12/09/21 1017  ?BP: (!) 143/82  ?Pulse: 68  ?Temp: 97.8 ?F (36.6 ?C)  ? ? ?Filed Weights  ? 12/09/21 1017  ?Weight: 248 lb (112.5 kg)  ? ? ? ?Physical Exam ?Constitutional:   ?   General: She is not in acute distress. ?   Appearance: She is not diaphoretic.  ?HENT:  ?   Head: Normocephalic and atraumatic.  ?   Nose: Nose normal.  ?   Mouth/Throat:  ?   Pharynx: No oropharyngeal exudate.  ?Eyes:  ?   General: No scleral icterus.    ?   Left eye: No discharge.  ?   Pupils: Pupils are equal, round, and reactive to light.  ?Neck:  ?   Vascular: No JVD.  ?Cardiovascular:  ?  Rate and Rhythm: Normal rate and regular rhythm.  ?   Heart sounds: Normal heart sounds. No murmur heard. ?Pulmonary:  ?   Effort: Pulmonary effort is normal. No respiratory distress.  ?   Breath sounds: Normal breath sounds. No wheezing or rales.  ?Chest:  ?   Chest wall: No tenderness.  ?Abdominal:  ?   General: There is no distension.  ?   Palpations: Abdomen is soft. There is no mass.  ?   Tenderness: There is no abdominal tenderness. There is no rebound.  ?Musculoskeletal:     ?   General: No tenderness. Normal range of motion.  ?   Cervical back: Normal range of motion and neck supple.  ?Lymphadenopathy:  ?   Cervical: No cervical adenopathy.  ?Skin: ?   General: Skin is warm and dry.  ?   Findings: No erythema or rash.  ?Neurological:  ?   Mental Status: She is alert and oriented to person, place, and time.  ?   Motor: No abnormal muscle tone.  ?Psychiatric:     ?   Mood and Affect: Affect normal.  ?Breast exam was performed in seated and lying down position.   ?Patient is status post lumpectomy of left breast  with a well-healed surgical scar with mild tissue thickening around the lumpectomy site.  No palpable masses bilateral breast  . No palpable bilateral axillary adenopathy.   ? ?RADIOGRAPHIC STUDIES: ?I have personally reviewed the radiological images as listed and agreed with the findings in the  report. ?No results found. ? ? ?ASSESSMENT/PLAN ? Cancer Staging  ?Malignant neoplasm of upper-outer quadrant of left breast in female, estrogen receptor positive ([place]) ?Staging form: Breast, AJCC 8th Edition ?- C

## 2021-12-30 ENCOUNTER — Ambulatory Visit
Admission: RE | Admit: 2021-12-30 | Discharge: 2021-12-30 | Disposition: A | Discharge status: Home / Self Care | Payer: BLUE CROSS/BLUE SHIELD | Source: Ambulatory Visit | Attending: Oncology | Admitting: Oncology

## 2021-12-30 ENCOUNTER — Encounter: Payer: Self-pay | Admitting: Oncology

## 2021-12-30 DIAGNOSIS — C50412 Malignant neoplasm of upper-outer quadrant of left female breast: Secondary | ICD-10-CM | POA: Insufficient documentation

## 2021-12-30 DIAGNOSIS — Z170 Estrogen receptor positive status [ER+]: Secondary | ICD-10-CM | POA: Diagnosis present

## 2022-01-16 ENCOUNTER — Inpatient Hospital Stay: Payer: BLUE CROSS/BLUE SHIELD | Attending: Oncology

## 2022-01-16 ENCOUNTER — Other Ambulatory Visit: Payer: Self-pay

## 2022-01-16 ENCOUNTER — Inpatient Hospital Stay: Payer: BLUE CROSS/BLUE SHIELD

## 2022-01-16 VITALS — BP 139/72 | HR 64 | Temp 97.2°F | Resp 20

## 2022-01-16 DIAGNOSIS — M8580 Other specified disorders of bone density and structure, unspecified site: Secondary | ICD-10-CM | POA: Diagnosis present

## 2022-01-16 DIAGNOSIS — Z170 Estrogen receptor positive status [ER+]: Secondary | ICD-10-CM | POA: Insufficient documentation

## 2022-01-16 DIAGNOSIS — E559 Vitamin D deficiency, unspecified: Secondary | ICD-10-CM

## 2022-01-16 DIAGNOSIS — C50412 Malignant neoplasm of upper-outer quadrant of left female breast: Secondary | ICD-10-CM

## 2022-01-16 DIAGNOSIS — Z79811 Long term (current) use of aromatase inhibitors: Secondary | ICD-10-CM

## 2022-01-16 LAB — CBC WITH DIFFERENTIAL/PLATELET
Abs Immature Granulocytes: 0.02 10*3/uL (ref 0.00–0.07)
Basophils Absolute: 0.1 10*3/uL (ref 0.0–0.1)
Basophils Relative: 1 %
Eosinophils Absolute: 0.2 10*3/uL (ref 0.0–0.5)
Eosinophils Relative: 2 %
HCT: 42.2 % (ref 36.0–46.0)
Hemoglobin: 14.6 g/dL (ref 12.0–15.0)
Immature Granulocytes: 0 %
Lymphocytes Relative: 23 %
Lymphs Abs: 1.6 10*3/uL (ref 0.7–4.0)
MCH: 30.1 pg (ref 26.0–34.0)
MCHC: 34.6 g/dL (ref 30.0–36.0)
MCV: 87 fL (ref 80.0–100.0)
Monocytes Absolute: 0.5 10*3/uL (ref 0.1–1.0)
Monocytes Relative: 7 %
Neutro Abs: 4.7 10*3/uL (ref 1.7–7.7)
Neutrophils Relative %: 67 %
Platelets: 196 10*3/uL (ref 150–400)
RBC: 4.85 MIL/uL (ref 3.87–5.11)
RDW: 13.4 % (ref 11.5–15.5)
WBC: 7 10*3/uL (ref 4.0–10.5)
nRBC: 0 % (ref 0.0–0.2)

## 2022-01-16 LAB — COMPREHENSIVE METABOLIC PANEL
ALT: 25 U/L (ref 0–44)
AST: 20 U/L (ref 15–41)
Albumin: 4.1 g/dL (ref 3.5–5.0)
Alkaline Phosphatase: 44 U/L (ref 38–126)
Anion gap: 8 (ref 5–15)
BUN: 18 mg/dL (ref 8–23)
CO2: 26 mmol/L (ref 22–32)
Calcium: 9.1 mg/dL (ref 8.9–10.3)
Chloride: 105 mmol/L (ref 98–111)
Creatinine, Ser: 0.7 mg/dL (ref 0.44–1.00)
GFR, Estimated: >60 mL/min (ref >60)
Glucose, Bld: 103 mg/dL — ABNORMAL HIGH (ref 70–99)
Potassium: 4.2 mmol/L (ref 3.5–5.1)
Sodium: 139 mmol/L (ref 135–145)
Total Bilirubin: 0.8 mg/dL (ref 0.3–1.2)
Total Protein: 7.8 g/dL (ref 6.5–8.1)

## 2022-01-16 MED ORDER — SODIUM CHLORIDE 0.9 % IV SOLN
INTRAVENOUS | Status: DC
  Filled 2022-01-16: qty 250

## 2022-01-16 MED ORDER — ZOLEDRONIC ACID 4 MG/100ML IV SOLN
4.0000 mg | Freq: Once | INTRAVENOUS | Status: AC
  Administered 2022-01-16: 4 mg via INTRAVENOUS
  Filled 2022-01-16: qty 100

## 2022-03-16 ENCOUNTER — Encounter: Payer: Self-pay | Admitting: Oncology

## 2022-04-17 ENCOUNTER — Other Ambulatory Visit: Payer: Self-pay

## 2022-04-17 DIAGNOSIS — Z1231 Encounter for screening mammogram for malignant neoplasm of breast: Secondary | ICD-10-CM

## 2022-04-21 ENCOUNTER — Ambulatory Visit
Admission: RE | Admit: 2022-04-21 | Discharge: 2022-04-21 | Disposition: A | Discharge status: Home / Self Care | Payer: BLUE CROSS/BLUE SHIELD | Source: Ambulatory Visit | Attending: Oncology | Admitting: Oncology

## 2022-04-21 ENCOUNTER — Ambulatory Visit: Payer: Disability Insurance

## 2022-04-21 ENCOUNTER — Other Ambulatory Visit: Payer: Self-pay | Admitting: Oncology

## 2022-04-21 ENCOUNTER — Ambulatory Visit: Admission: RE | Admit: 2022-04-21 | Payer: Disability Insurance | Source: Ambulatory Visit

## 2022-04-21 DIAGNOSIS — Z1231 Encounter for screening mammogram for malignant neoplasm of breast: Secondary | ICD-10-CM | POA: Diagnosis present

## 2022-06-11 ENCOUNTER — Inpatient Hospital Stay: Payer: BLUE CROSS/BLUE SHIELD | Attending: Oncology

## 2022-06-11 ENCOUNTER — Encounter: Payer: Self-pay | Admitting: Oncology

## 2022-06-11 ENCOUNTER — Other Ambulatory Visit: Payer: Self-pay | Admitting: Oncology

## 2022-06-11 ENCOUNTER — Inpatient Hospital Stay (HOSPITAL_BASED_OUTPATIENT_CLINIC_OR_DEPARTMENT_OTHER): Payer: BLUE CROSS/BLUE SHIELD | Admitting: Oncology

## 2022-06-11 VITALS — BP 136/79 | HR 69 | Temp 97.8°F | Resp 18 | Wt 269.0 lb

## 2022-06-11 DIAGNOSIS — E559 Vitamin D deficiency, unspecified: Secondary | ICD-10-CM | POA: Diagnosis not present

## 2022-06-11 DIAGNOSIS — Z170 Estrogen receptor positive status [ER+]: Secondary | ICD-10-CM

## 2022-06-11 DIAGNOSIS — C50412 Malignant neoplasm of upper-outer quadrant of left female breast: Secondary | ICD-10-CM | POA: Insufficient documentation

## 2022-06-11 DIAGNOSIS — M8580 Other specified disorders of bone density and structure, unspecified site: Secondary | ICD-10-CM | POA: Diagnosis not present

## 2022-06-11 DIAGNOSIS — Z79899 Other long term (current) drug therapy: Secondary | ICD-10-CM | POA: Diagnosis not present

## 2022-06-11 DIAGNOSIS — Z79811 Long term (current) use of aromatase inhibitors: Secondary | ICD-10-CM | POA: Insufficient documentation

## 2022-06-11 LAB — COMPREHENSIVE METABOLIC PANEL
ALT: 33 U/L (ref 0–44)
AST: 28 U/L (ref 15–41)
Albumin: 4 g/dL (ref 3.5–5.0)
Alkaline Phosphatase: 42 U/L (ref 38–126)
Anion gap: 5 (ref 5–15)
BUN: 13 mg/dL (ref 8–23)
CO2: 26 mmol/L (ref 22–32)
Calcium: 9.2 mg/dL (ref 8.9–10.3)
Chloride: 107 mmol/L (ref 98–111)
Creatinine, Ser: 0.73 mg/dL (ref 0.44–1.00)
GFR, Estimated: >60 mL/min (ref >60)
Glucose, Bld: 124 mg/dL — ABNORMAL HIGH (ref 70–99)
Potassium: 4.4 mmol/L (ref 3.5–5.1)
Sodium: 138 mmol/L (ref 135–145)
Total Bilirubin: 0.6 mg/dL (ref 0.3–1.2)
Total Protein: 7.5 g/dL (ref 6.5–8.1)

## 2022-06-11 LAB — CBC WITH DIFFERENTIAL/PLATELET
Abs Immature Granulocytes: 0.02 10*3/uL (ref 0.00–0.07)
Basophils Absolute: 0.1 10*3/uL (ref 0.0–0.1)
Basophils Relative: 1 %
Eosinophils Absolute: 0.2 10*3/uL (ref 0.0–0.5)
Eosinophils Relative: 3 %
HCT: 41.8 % (ref 36.0–46.0)
Hemoglobin: 14.1 g/dL (ref 12.0–15.0)
Immature Granulocytes: 0 %
Lymphocytes Relative: 25 %
Lymphs Abs: 1.5 10*3/uL (ref 0.7–4.0)
MCH: 29.7 pg (ref 26.0–34.0)
MCHC: 33.7 g/dL (ref 30.0–36.0)
MCV: 88.2 fL (ref 80.0–100.0)
Monocytes Absolute: 0.5 10*3/uL (ref 0.1–1.0)
Monocytes Relative: 8 %
Neutro Abs: 3.7 10*3/uL (ref 1.7–7.7)
Neutrophils Relative %: 63 %
Platelets: 184 10*3/uL (ref 150–400)
RBC: 4.74 MIL/uL (ref 3.87–5.11)
RDW: 13.4 % (ref 11.5–15.5)
WBC: 5.9 10*3/uL (ref 4.0–10.5)
nRBC: 0 % (ref 0.0–0.2)

## 2022-06-11 LAB — VITAMIN D 25 HYDROXY (VIT D DEFICIENCY, FRACTURES): Vit D, 25-Hydroxy: 22.03 ng/mL — ABNORMAL LOW (ref 30–100)

## 2022-06-11 NOTE — Assessment & Plan Note (Addendum)
#  Stage IA Breast cancer,-2018 Labs reviewed and discussed with patient.  Clinically she is doing very well Today's physical examination did not reveal any suspicious lymphadenopathy or breast mass. September 2023 bilateral screening mammogram negative.  She has finished 5 years of letrozole 2.5 mg daily She may finish her current supply and stop afterwards.  Continue annual mammogram.

## 2022-06-11 NOTE — Progress Notes (Signed)
Hematology/Oncology Progress note [phone] [phone]   ASSESSMENT & PLAN:   Malignant neoplasm of upper-outer quadrant of left breast in female, estrogen receptor positive ([place]) #Stage IA Breast cancer,-2018 Labs reviewed and discussed with patient.  Clinically Norma Austin is doing very well Today's physical examination did not reveal any suspicious lymphadenopathy or breast mass. September 2023 bilateral screening mammogram negative.  Norma Austin has finished 5 years of letrozole 2.5 mg daily Norma Austin may finish Norma Austin current supply and stop afterwards.  Continue annual mammogram.   Osteopenia # Osteopenia, continue calcium and vitamin D supplementation  Zometa treatments every 6 months.  DEXA every 2 years next due May 2025  Vitamin D deficiency Continue Vitamin D supplement. Repeat level is pending.    Orders Placed This Encounter  Procedures   Basic metabolic panel    Standing Status:   Future    Standing Expiration Date:   06/11/2023   CBC with Differential/Platelet    Standing Status:   Future    Standing Expiration Date:   06/12/2023   Comprehensive metabolic panel    Standing Status:   Future    Standing Expiration Date:   06/11/2023   Follow up  Dec 2023 lab BMP + Zometa 6 months  lab MD + Zometa   6 months.  All questions were answered. The patient knows to call the clinic with any problems, questions or concerns.  Earlie Server, MD, PhD [facility] Hematology Oncology 06/11/2022     REASON FOR VISIT Follow up for treatment of Stage IA  ER+ PR+ breast cancer  HISTORY OF PRESENTING ILLNESS: 1 Norma Austin 61 y.o. female presents for follow up of Stage IA  ER+ PR+ breast cancer .  Patient had routine mammogram screening which showed left breast mass. This was followed by a diagnostic Mammogram and ultrasound which showed an hypoechoic mass measuring 12 x 7 x 10 mm at 1:30, 7 cm from the nipple, corresponding to thespiculated mass seen mammographically. There is  an adjacent near anechoic mass at 1 o'clock, 6 cm from the nipple measuring 6 x 4 x 4 mm. 1:30 mass was biopsy and showed invasive carcinoma of breast, unspecified type, no LVI, No DCIS. The 1 o'clock mass disappeared after biopsy of the 1:30 mass. It maybe a cyst which was poked by the needle for anesthesia.   2 Pathology: 02/24/2017 US guided breast mass biopsy showed invasive mammary carcinoma, grade 1, ER 90% PR90%, HER2 Equivocal by IHC, negative by FISH. S/p left breast lumpectomy and sentinel lymph node biopsy on 03/18/2017. Pathology showed 65m invasive mammary carcinoma of no specific type, margins are all negative, and all 3 sentinel lymph nodes were negative. ER/PR 90%, HER2 FISH negative. Mammaprint showed low risk.  3  lumpectomy followed by MammoSite radiation. Mammaprint indicates low risk. Adjuvant chemotherapy is not offered.   Interval History:  Patient with above oncology history reviewed by me present for follow-up management of stage IA ER/PR positive Norma Austin 2 negative breast cancer. Norma Austin takes letrozole 2.5 mg daily with manageable side effects.  Overall Norma Austin tolerates well No new complaints.  .   Review of Systems  Constitutional:  Negative for chills, fever, malaise/fatigue and weight loss.  HENT:  Negative for sore throat.   Eyes:  Negative for redness.  Respiratory:  Negative for cough, shortness of breath and wheezing.   Cardiovascular:  Negative for chest pain, palpitations and leg swelling.  Gastrointestinal:  Negative for abdominal pain, blood in stool, nausea and vomiting.  Genitourinary:  Negative for dysuria.  Musculoskeletal:  Positive for joint pain. Negative for myalgias.  Skin:  Negative for rash.  Neurological:  Negative for dizziness, tingling and tremors.  Endo/Heme/Allergies:  Does not bruise/bleed easily.  Psychiatric/Behavioral:  Negative for hallucinations.     MEDICAL HISTORY: Past Medical History:  Diagnosis Date   Arthritis    Breast cancer  ([place]) 2018   INVASIVE MAMMARY CARCINOMA, T1c, N0, M0. ER PR positive and Norma Austin-2 negative   GERD (gastroesophageal reflux disease)    Indigestion    Osteopenia 12/26/2018   Personal history of radiation therapy 2018   F/U left breast cancer   Plantar fasciitis     SURGICAL HISTORY: Past Surgical History:  Procedure Laterality Date   BREAST BIOPSY Left 02/24/2017   INVASIVE MAMMARY CARCINOMA   BREAST LUMPECTOMY Left 2018   Invasive, F/U with radiation and Letrozole    BREAST LUMPECTOMY WITH SENTINEL LYMPH NODE BIOPSY Left 03/18/2017   Procedure: BREAST LUMPECTOMY WITH SENTINEL LYMPH NODE BX;  Surgeon: [name], MD;  Location: [facility] ORS;  Service: General;  Laterality: Left;   BREAST MAMMOSITE Left 03/2017   GANGLION CYST EXCISION  1990   MOUTH SURGERY     4 BOTTOM FRONT DENTAL IMPLANTS    SOCIAL HISTORY: Social History   Socioeconomic History   Marital status: Married    Spouse name: Not on file   Number of children: Not on file   Years of education: Not on file   Highest education level: Not on file  Occupational History   Not on file  Tobacco Use   Smoking status: Former    Packs/day: 1.00    Years: 25.00    Total pack years: 25.00    Types: Cigarettes    Quit date: 03/12/2007    Years since quitting: 15.2   Smokeless tobacco: Never  Vaping Use   Vaping Use: Never used  Substance and Sexual Activity   Alcohol use: Yes    Alcohol/week: 0.0 standard drinks of alcohol    Comment: occasional   Drug use: No   Sexual activity: Not on file  Other Topics Concern   Not on file  Social History Narrative   Not on file   Social Determinants of Health   Financial Resource Strain: Not on file  Food Insecurity: Not on file  Transportation Needs: Not on file  Physical Activity: Not on file  Stress: Not on file  Social Connections: Not on file  Intimate Partner Violence: Not on file    FAMILY HISTORY Family History  Problem Relation Age of Onset   Breast  cancer Mother 68   CVA Mother    Lung cancer Father    Thyroid cancer Cousin    Colon cancer Neg Hx     ALLERGIES:  is allergic to adhesive [tape].  MEDICATIONS:  Current Outpatient Medications  Medication Sig Dispense Refill   acetaminophen (TYLENOL) 500 MG tablet Take 1,000 mg by mouth every 6 (six) hours as needed (for pain.).     celecoxib (CELEBREX) 100 MG capsule Take 100 mg by mouth 2 (two) times daily.     Cholecalciferol (VITAMIN D) 50 MCG (2000 UT) tablet Take 1 tablet (2,000 Units total) by mouth daily. 90 tablet 1   diclofenac (VOLTAREN) 50 MG EC tablet Take 50 mg by mouth 2 (two) times daily as needed (for plantar fasciitis/arthritis.).      loratadine (CLARITIN) 10 MG tablet Take 10 mg by mouth daily.  rosuvastatin (CRESTOR) 10 MG tablet Take 10 mg by mouth daily.     No current facility-administered medications for this visit.    PHYSICAL EXAMINATION:  ECOG PERFORMANCE STATUS: 0 - Asymptomatic   Vitals:   06/11/22 1033  BP: 136/79  Pulse: 69  Resp: 18  Temp: 97.8 F (36.6 C)  SpO2: 93%    Filed Weights   06/11/22 1033  Weight: 269 lb (122 kg)     Physical Exam Constitutional:      General: Norma Austin is not in acute distress.    Appearance: Norma Austin is not diaphoretic.  HENT:     Head: Normocephalic and atraumatic.     Nose: Nose normal.     Mouth/Throat:     Pharynx: No oropharyngeal exudate.  Eyes:     General: No scleral icterus.       Left eye: No discharge.     Pupils: Pupils are equal, round, and reactive to light.  Neck:     Vascular: No JVD.  Cardiovascular:     Rate and Rhythm: Normal rate and regular rhythm.     Heart sounds: Normal heart sounds. No murmur heard. Pulmonary:     Effort: Pulmonary effort is normal. No respiratory distress.     Breath sounds: Normal breath sounds. No wheezing or rales.  Chest:     Chest wall: No tenderness.  Abdominal:     General: There is no distension.     Palpations: Abdomen is soft. There is no  mass.     Tenderness: There is no abdominal tenderness. There is no rebound.  Musculoskeletal:        General: No tenderness. Normal range of motion.     Cervical back: Normal range of motion and neck supple.  Lymphadenopathy:     Cervical: No cervical adenopathy.  Skin:    General: Skin is warm and dry.     Findings: No erythema or rash.  Neurological:     Mental Status: Norma Austin is alert and oriented to person, place, and time.     Motor: No abnormal muscle tone.  Psychiatric:        Mood and Affect: Affect normal.     RADIOGRAPHIC STUDIES: I have personally reviewed the radiological images as listed and agreed with the findings in the report. MM 3D SCREEN BREAST BILATERAL  Result Date: 04/22/2022 CLINICAL DATA:  Screening. EXAM: DIGITAL SCREENING BILATERAL MAMMOGRAM WITH TOMOSYNTHESIS AND CAD TECHNIQUE: Bilateral screening digital craniocaudal and mediolateral oblique mammograms were obtained. Bilateral screening digital breast tomosynthesis was performed. The images were evaluated with computer-aided detection. COMPARISON:  Previous exam(s). ACR Breast Density Category b: There are scattered areas of fibroglandular density. FINDINGS: There are no findings suspicious for malignancy. IMPRESSION: No mammographic evidence of malignancy. A result letter of this screening mammogram will be mailed directly to the patient. RECOMMENDATION: Screening mammogram in one year. (Code:SM-B-01Y) BI-RADS CATEGORY  1: Negative. Electronically Signed   By: Marin Olp M.D.   On: 04/22/2022 11:47     ASSESSMENT/PLAN  Cancer Staging  Malignant neoplasm of upper-outer quadrant of left breast in female, estrogen receptor positive ([place]) Staging form: Breast, AJCC 8th Edition - Clinical stage from 03/03/2017: Stage IA (cT1c, cN0, cM0, G1, ER+, PR+, HER2: Equivocal) - Signed by Earlie Server, MD on 03/03/2017 Neoadjuvant therapy: No Method of lymph node assessment: Clinical Histologic grading system: 3 grade  system Laterality: Left Tumor size (mm): 12 Lymph-vascular invasion (LVI): LVI not present (absent)/not identified - Pathologic stage from 04/30/2017:  pT1c, pN0, cM0, ER+, PR+, HER2- - Signed by Earlie Server, MD on 04/30/2017 HER2-FISH interpretation: Negative  1. Malignant neoplasm of upper-outer quadrant of left breast in female, estrogen receptor positive ([place])   2. Osteopenia, unspecified location   3. Vitamin D deficiency   #Stage IA Breast cancer,-2018 Labs reviewed and discussed with patient.  Clinically Norma Austin is doing very well Today's physical examination did not reveal any suspicious lymphadenopathy or breast mass. Continue annual bilateral screening mammogram.-To be scheduled in September 2023. Continue letrozole 2.5 mg daily.-Norma Austin gets prescription refilled at primary care provider's office. Norma Austin will be finishing 5 years of letrozole around next visit.    # Osteopenia, continue calcium and vitamin D supplementation.  Norma Austin is due for repeating bone density in May 2023.  Will obtain.  # Patient has been on Zometa treatments every 6 months.  Norma Austin will get next treatment in June 2022. -Norma Austin will also get BMP prior to getting Zometa treatment.  #Vitamin D deficiency,  Continue vitamin D supplementation.  I will check Norma Austin vitamin D level at the next visit.   Follow up in  6 months.  All questions were answered. The patient knows to call the clinic with any problems, questions or concerns.   Earlie Server MD PhD [facility]  06/11/22

## 2022-06-11 NOTE — Progress Notes (Signed)
Patient here for oncology follow-up appointment, expresses no complaints or concerns at this time.    

## 2022-06-11 NOTE — Assessment & Plan Note (Addendum)
#   Osteopenia, continue calcium and vitamin D supplementation  Zometa treatments every 6 months.  DEXA every 2 years next due May 2025

## 2022-06-11 NOTE — Assessment & Plan Note (Signed)
Continue Vitamin D supplement. Repeat level is pending.

## 2022-06-27 ENCOUNTER — Other Ambulatory Visit: Payer: Self-pay | Admitting: Oncology

## 2022-06-27 MED ORDER — ERGOCALCIFEROL 1.25 MG (50000 UT) PO CAPS: 50000.0000 [IU] | ORAL_CAPSULE | ORAL | 0 refills | Status: DC

## 2022-07-20 ENCOUNTER — Encounter: Payer: Self-pay | Admitting: Oncology

## 2022-07-20 ENCOUNTER — Inpatient Hospital Stay: Payer: BLUE CROSS/BLUE SHIELD | Attending: Oncology

## 2022-07-20 ENCOUNTER — Inpatient Hospital Stay: Payer: BLUE CROSS/BLUE SHIELD

## 2022-07-20 VITALS — BP 135/72 | HR 90 | Temp 97.2°F | Resp 18 | Wt 271.0 lb

## 2022-07-20 DIAGNOSIS — Z170 Estrogen receptor positive status [ER+]: Secondary | ICD-10-CM

## 2022-07-20 DIAGNOSIS — M8580 Other specified disorders of bone density and structure, unspecified site: Secondary | ICD-10-CM

## 2022-07-20 LAB — BASIC METABOLIC PANEL
Anion gap: 11 (ref 5–15)
BUN: 10 mg/dL (ref 8–23)
CO2: 26 mmol/L (ref 22–32)
Calcium: 8.8 mg/dL — ABNORMAL LOW (ref 8.9–10.3)
Chloride: 101 mmol/L (ref 98–111)
Creatinine, Ser: 0.67 mg/dL (ref 0.44–1.00)
GFR, Estimated: >60 mL/min (ref >60)
Glucose, Bld: 163 mg/dL — ABNORMAL HIGH (ref 70–99)
Potassium: 4.2 mmol/L (ref 3.5–5.1)
Sodium: 138 mmol/L (ref 135–145)

## 2022-07-20 MED ORDER — ZOLEDRONIC ACID 4 MG/100ML IV SOLN
4.0000 mg | Freq: Once | INTRAVENOUS | Status: AC
  Administered 2022-07-20: 4 mg via INTRAVENOUS
  Filled 2022-07-20: qty 100

## 2022-07-20 MED ORDER — SODIUM CHLORIDE 0.9 % IV SOLN
Freq: Once | INTRAVENOUS | Status: AC
  Filled 2022-07-20: qty 250

## 2022-08-10 ENCOUNTER — Encounter: Payer: Self-pay | Admitting: Emergency Medicine

## 2022-08-10 ENCOUNTER — Other Ambulatory Visit: Payer: Self-pay

## 2022-08-10 ENCOUNTER — Encounter: Payer: Self-pay | Admitting: Oncology

## 2022-08-10 ENCOUNTER — Ambulatory Visit
Admission: EM | Admit: 2022-08-10 | Discharge: 2022-08-10 | Disposition: A | Discharge status: Home / Self Care | Payer: Medicaid Other | Attending: Urgent Care | Admitting: Urgent Care

## 2022-08-10 DIAGNOSIS — B9789 Other viral agents as the cause of diseases classified elsewhere: Secondary | ICD-10-CM

## 2022-08-10 DIAGNOSIS — J019 Acute sinusitis, unspecified: Secondary | ICD-10-CM | POA: Diagnosis not present

## 2022-08-10 MED ORDER — PREDNISONE 20 MG PO TABS: ORAL_TABLET | ORAL | 0 refills | Status: AC

## 2022-08-10 NOTE — Discharge Instructions (Addendum)
You have been diagnosed with a viral upper respiratory infection based on your symptoms and exam. Viral illnesses cannot be treated with antibiotics - they are self limiting - and you should find your symptoms resolving within a few days. Get plenty of rest and non-caffeinated fluids. Watch for signs of dehydration including reduced urine output and dark colored urine.  We recommend you use over-the-counter medications for symptom control including acetaminophen (Tylenol), ibuprofen (Advil/Motrin) or naproxen (Aleve) for fever, chills or body aches. You may combine use of acetaminophen and ibuprofen/naproxen if needed. Also recommend cold/cough medication.    Saline mist spray is helpful for removing excess mucus from your nose.  Room humidifiers are helpful to ease breathing at night. I recommend guaifenesin (Mucinex) with plenty of water throughout the day to help thin and loosen mucus secretions in your respiratory passages.   If appropriate based upon your other medical problems, you might also find relief of nasal/sinus congestion symptoms by using a nasal decongestant such as Flonase (fluticasone) or Sudafed sinus (pseudoephedrine).  You will need to obtain Sudafed from behind the pharmacist counter.  Speak to the pharmacist to verify that you are not duplicating medications with other over-the-counter formulations that you may be using.

## 2022-08-10 NOTE — ED Triage Notes (Signed)
Onset Wednesday or Thursday of symptoms.  Fever as high as 101.7, chest tightness, decreased appetite, chest congestion.  Reports hearing crackling sound in upper chest with coughing.  Pain in left ear and down left side of neck  Has taken mucinex and then day quil

## 2022-08-10 NOTE — ED Provider Notes (Signed)
[facility]    CSN: [id number] Arrival date & time: 08/10/22  1211      History   Chief Complaint No chief complaint on file.   HPI Norma Austin is a 62 y.o. female.   HPI  Patient endorses symptoms x 5 days.  Symptoms include fever, chest tightness, decreased appetite, chest congestion.  She states she hears crackling in her upper chest with cough.  Also reports left ear pain and down the left side of her neck.  She is using Mucinex and DayQuil to treat her symptoms.  Past Medical History:  Diagnosis Date  . Arthritis   . Breast cancer ([place]) 2018   INVASIVE MAMMARY CARCINOMA, T1c, N0, 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  . GERD (gastroesophageal reflux disease)   . Indigestion   . Osteopenia 12/26/2018  . Personal history of radiation therapy 2018   F/U left breast cancer  . Plantar fasciitis     Patient Active Problem List   Diagnosis Date Noted  . Aromatase inhibitor use 06/07/2020  . Er+ PR+ carcinoma of breast, left ([place]) 06/07/2020  . Vitamin D deficiency 04/11/2019  . Use of letrozole ([place]) 04/11/2019  . Osteopenia 12/26/2018  . Malignant neoplasm of upper-outer quadrant of left breast in female, estrogen receptor positive ([place]) 03/03/2017    Past Surgical History:  Procedure Laterality Date  . BREAST BIOPSY Left 02/24/2017   INVASIVE MAMMARY CARCINOMA  . BREAST LUMPECTOMY Left 2018   Invasive, F/U with radiation and Letrozole   . BREAST LUMPECTOMY WITH SENTINEL LYMPH NODE BIOPSY Left 03/18/2017   Procedure: BREAST LUMPECTOMY WITH SENTINEL LYMPH NODE BX;  Surgeon: [name], MD;  Location: [facility] ORS;  Service: General;  Laterality: Left;  . BREAST MAMMOSITE Left 03/2017  . GANGLION CYST EXCISION  1990  . MOUTH SURGERY     4 BOTTOM FRONT DENTAL IMPLANTS    OB History     Gravida  0   Para  0   Term  0   Preterm  0   AB  0   Living  0      SAB  0   IAB  0   Ectopic  0   Multiple  0   Live Births  0         Obstetric Comments  1st Menstrual Cycle:  10             Home Medications    Prior to Admission medications   Medication Sig Start Date End Date Taking? Authorizing Provider  acetaminophen (TYLENOL) 500 MG tablet Take 1,000 mg by mouth every 6 (six) hours as needed (for pain.).    [provider]  celecoxib (CELEBREX) 100 MG capsule Take 100 mg by mouth 2 (two) times daily. 11/07/21   [provider]  diclofenac (VOLTAREN) 50 MG EC tablet Take 50 mg by mouth 2 (two) times daily as needed (for plantar fasciitis/arthritis.).  02/11/17   [provider]  ergocalciferol (VITAMIN D2) 1.25 MG (50000 UT) capsule Take 1 capsule (50,000 Units total) by mouth once a week. 06/27/22   Earlie Server, MD  loratadine (CLARITIN) 10 MG tablet Take 10 mg by mouth daily.    [provider]  rosuvastatin (CRESTOR) 10 MG tablet Take 10 mg by mouth daily. 12/09/18   [provider]    Family History Family History  Problem Relation Age of Onset  . Breast cancer Mother 72  . CVA Mother   .  Lung cancer Father   . Thyroid cancer Cousin   . Colon cancer Neg Hx     Social History Social History   Tobacco Use  . Smoking status: Former    Packs/day: 1.00    Years: 25.00    Total pack years: 25.00    Types: Cigarettes    Quit date: 03/12/2007    Years since quitting: 15.4  . Smokeless tobacco: Never  Vaping Use  . Vaping Use: Never used  Substance Use Topics  . Alcohol use: Yes    Alcohol/week: 0.0 standard drinks of alcohol    Comment: occasional  . Drug use: No     Allergies   Adhesive [tape]   Review of Systems Review of Systems   Physical Exam Triage Vital Signs ED Triage Vitals  Enc Vitals Group     BP 08/10/22 1306 132/82     Pulse Rate 08/10/22 1306 82     Resp 08/10/22 1306 16     Temp 08/10/22 1306 97.8 F (36.6 C)     Temp Source 08/10/22 1306 Temporal     SpO2 08/10/22 1306 92 %     Weight --      Height --      Head  Circumference --      Peak Flow --      Pain Score 08/10/22 1316 5     Pain Loc --      Pain Edu? --      Excl. in [place]? --    No data found.  Updated Vital Signs BP 132/82 (BP Location: Right Arm)   Pulse 82   Temp 97.8 F (36.6 C) (Temporal)   Resp 16   LMP 03/19/2011 (Approximate) Comment: LMP 2011 or 2012  SpO2 92%   Visual Acuity Right Eye Distance:   Left Eye Distance:   Bilateral Distance:    Right Eye Near:   Left Eye Near:    Bilateral Near:     Physical Exam Vitals reviewed.  Constitutional:      Appearance: Normal appearance.  HENT:     Head:     Jaw: Tenderness present.     Nose:     Left Sinus: Maxillary sinus tenderness present.  Cardiovascular:     Rate and Rhythm: Normal rate and regular rhythm.     Pulses: Normal pulses.     Heart sounds: Normal heart sounds.  Pulmonary:     Effort: Pulmonary effort is normal.     Breath sounds: Normal breath sounds.  Skin:    General: Skin is warm and dry.  Neurological:     General: No focal deficit present.     Mental Status: She is alert and oriented to person, place, and time.  Psychiatric:        Mood and Affect: Mood normal.        Behavior: Behavior normal.     UC Treatments / Results  Labs (all labs ordered are listed, but only abnormal results are displayed) Labs Reviewed - No data to display  EKG   Radiology No results found.  Procedures Procedures (including critical care time)  Medications Ordered in UC Medications - No data to display  Initial Impression / Assessment and Plan / UC Course  I have reviewed the triage vital signs and the nursing notes.  Pertinent labs & imaging results that were available during my care of the patient were reviewed by me and considered in my medical decision making (see chart for  details).   Patient is afebrile here without recent antipyretics. Satting well on room air. Overall is ill appearing, well hydrated, without respiratory distress. Pulmonary  exam is unremarkable.  Lungs CTAB without wheezing, rhonchi, rales.  Acute maxillary tenderness with palpation.  Symptoms are consistent with acute viral process.  Recommending use of OTC medication for symptom control.  Will also prescribe a course of prednisone to reduce sinus inflammation and relieve facial pain.  Final Clinical Impressions(s) / UC Diagnoses   Final diagnoses:  None   Discharge Instructions   None    ED Prescriptions   None    PDMP not reviewed this encounter.   Rose Phi, [place] 08/10/22 1332

## 2022-09-24 ENCOUNTER — Ambulatory Visit: Payer: Medicaid Other

## 2022-09-24 ENCOUNTER — Encounter: Payer: Self-pay | Admitting: Oncology

## 2022-09-28 ENCOUNTER — Ambulatory Visit (INDEPENDENT_AMBULATORY_CARE_PROVIDER_SITE_OTHER): Payer: Medicaid Other | Admitting: Internal Medicine

## 2022-09-28 VITALS — BP 137/83 | HR 82 | Resp 16 | Ht 66.0 in | Wt 267.0 lb

## 2022-09-28 DIAGNOSIS — G4733 Obstructive sleep apnea (adult) (pediatric): Secondary | ICD-10-CM

## 2022-09-28 NOTE — Progress Notes (Unsigned)
Sleep Medicine   Office Visit  Patient Name: Norma Austin DOB: [birth year] MRN [id number]    Chief Complaint: sleep evaluation  Brief History:  Mikya presents with a 45 year history of snoring that has worsened in the last 15 year.   She reports her sleep quality is poor due to not feeling refreshed from her sleep. This is noted all nights. The patient's bed partner reports  witnessed apnea and loud snoring at night. The patient relates the following symptoms: loud snoring that wakes her, EDS, waking tired, some morning headaches, brain fog, lack of focus and patient does use caffeine to give her energy. are also present. Patient reports sleepy driving as well.The patient goes to sleep at 12:30am and wakes up at 7:00am. She wakes at least 3-4 times nightly and can not explain why.  Sleep quality is worse when outside home environment.  Patient has noted restlessness of her legs at night.  The patient  relates no unusual behavior during the night.  The patient denies a history of psychiatric problems. The Epworth Sleepiness Score is 15 out of 24 .  The patient relates  Cardiovascular risk factors include: none     ROS  General: (-) fever, (-) chills, (-) night sweat Nose and Sinuses: (-) nasal stuffiness or itchiness, (-) postnasal drip, (-) nosebleeds, (-) sinus trouble. Mouth and Throat: (-) sore throat, (-) hoarseness. Neck: (-) swollen glands, (-) enlarged thyroid, (-) neck pain. Respiratory: - cough, +shortness of breath, - wheezing. Neurologic: - numbness, - tingling. Psychiatric: - anxiety, - depression Sleep behavior: -sleep paralysis -hypnogogic hallucinations -dream enactment      -vivid dreams -cataplexy -night terrors -sleep walking   Current Medication: Outpatient Encounter Medications as of 09/28/2022  Medication Sig   acetaminophen (TYLENOL) 500 MG tablet Take 1,000 mg by mouth every 6 (six) hours as needed (for pain.).   celecoxib (CELEBREX) 100 MG capsule Take 100  mg by mouth 2 (two) times daily.   ergocalciferol (VITAMIN D2) 1.25 MG (50000 UT) capsule Take 1 capsule (50,000 Units total) by mouth once a week.   loratadine (CLARITIN) 10 MG tablet Take 10 mg by mouth daily.   Multiple Vitamins-Minerals (CENTRUM SILVER 50+WOMEN PO) Take by mouth.   rosuvastatin (CRESTOR) 10 MG tablet Take 10 mg by mouth daily.   [DISCONTINUED] diclofenac (VOLTAREN) 50 MG EC tablet Take 50 mg by mouth 2 (two) times daily as needed (for plantar fasciitis/arthritis.).    No facility-administered encounter medications on file as of 09/28/2022.    Surgical History: Past Surgical History:  Procedure Laterality Date   BREAST BIOPSY Left 02/24/2017   INVASIVE MAMMARY CARCINOMA   BREAST LUMPECTOMY Left 2018   Invasive, F/U with radiation and Letrozole    BREAST LUMPECTOMY WITH SENTINEL LYMPH NODE BIOPSY Left 03/18/2017   Procedure: BREAST LUMPECTOMY WITH SENTINEL LYMPH NODE BX;  Surgeon: [name], MD;  Location: [facility] ORS;  Service: General;  Laterality: Left;   BREAST MAMMOSITE Left 03/2017   GANGLION CYST EXCISION  1990   MOUTH SURGERY     4 BOTTOM FRONT DENTAL IMPLANTS    Medical History: Past Medical History:  Diagnosis Date   Arthritis    Breast cancer ([place]) 2018   INVASIVE MAMMARY CARCINOMA, T1c, N0, 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   GERD (gastroesophageal reflux disease)    Indigestion    Osteopenia 12/26/2018   Personal history of radiation therapy 2018   F/U left breast cancer   Plantar fasciitis  Family History: Non contributory to the present illness  Social History: Social History   Socioeconomic History   Marital status: Married    Spouse name: Not on file   Number of children: Not on file   Years of education: Not on file   Highest education level: Not on file  Occupational History   Not on file  Tobacco Use   Smoking status: Former    Packs/day: 1.00    Years: 25.00    Total pack years: 25.00    Types: Cigarettes     Quit date: 03/12/2007    Years since quitting: 15.5   Smokeless tobacco: Never  Vaping Use   Vaping Use: Never used  Substance and Sexual Activity   Alcohol use: Yes    Alcohol/week: 0.0 standard drinks of alcohol    Comment: occasional   Drug use: No   Sexual activity: Not on file  Other Topics Concern   Not on file  Social History Narrative   Not on file   Social Determinants of Health   Financial Resource Strain: Not on file  Food Insecurity: Not on file  Transportation Needs: Not on file  Physical Activity: Not on file  Stress: Not on file  Social Connections: Not on file  Intimate Partner Violence: Not on file    Vital Signs: Blood pressure 137/83, pulse 82, resp. rate 16, height 5' 6"$  (1.676 m), weight 267 lb (121.1 kg), last menstrual period 03/19/2011, SpO2 94 %. Body mass index is 43.09 kg/m.   Examination: General Appearance: The patient is well-developed, well-nourished, and in no distress. Neck Circumference: 47cm Skin: Gross inspection of skin unremarkable. Head: normocephalic, no gross deformities. Eyes: no gross deformities noted. ENT: ears appear grossly normal Neurologic: Alert and oriented. No involuntary movements.    STOP BANG RISK ASSESSMENT S (snore) Have you been told that you snore?     YES   T (tired) Are you often tired, fatigued, or sleepy during the day?   YES  O (obstruction) Do you stop breathing, choke, or gasp during sleep? YES   P (pressure) Do you have or are you being treated for high blood pressure? NO   B (BMI) Is your body index greater than 35 kg/m? YES   A (age) Are you 64 years old or older? YES   N (neck) Do you have a neck circumference greater than 16 inches?   YES   G (gender) Are you a female? NO   TOTAL STOP/BANG "YES" ANSWERS 6                                                               A STOP-Bang score of 2 or less is considered low risk, and a score of 5 or more is high risk for having either  moderate or severe OSA. For people who score 3 or 4, doctors may need to perform further assessment to determine how likely they are to have OSA.         EPWORTH SLEEPINESS SCALE:  Scale:  (0)= no chance of dozing; (1)= slight chance of dozing; (2)= moderate chance of dozing; (3)= high chance of dozing  Chance  Situtation    Sitting and reading: 3    Watching TV: 2    Sitting  Inactive in public: 1    As a passenger in car: 3      Lying down to rest: 3    Sitting and talking: 0    Sitting quielty after lunch: 2    In a car, stopped in traffic: 1   TOTAL SCORE:   15 out of 24    SLEEP STUDIES:  Patient reports an HST about 6 years ago. An in lab test was suggested but not pursued.   LABS: Recent Results (from the past 2160 hour(s))  Basic metabolic panel     Status: Abnormal   Collection Time: 07/20/22  1:07 PM  Result Value Ref Range   Sodium 138 135 - 145 mmol/L   Potassium 4.2 3.5 - 5.1 mmol/L   Chloride 101 98 - 111 mmol/L   CO2 26 22 - 32 mmol/L   Glucose, Bld 163 (H) 70 - 99 mg/dL    Comment: Glucose reference range applies only to samples taken after fasting for at least 8 hours.   BUN 10 8 - 23 mg/dL   Creatinine, Ser 0.67 0.44 - 1.00 mg/dL   Calcium 8.8 (L) 8.9 - 10.3 mg/dL   GFR, Estimated >60 >60 mL/min    Comment: (NOTE) Calculated using the CKD-EPI Creatinine Equation (2021)    Anion gap 11 5 - 15    Comment: Performed at [facility], [address], [place], [place] [address]    Radiology: No results found.  No results found.  No results found.    Assessment and Plan: Patient Active Problem List   Diagnosis Date Noted   OSA (obstructive sleep apnea) 09/28/2022   Morbid obesity ([place]) 09/28/2022   Aromatase inhibitor use 06/07/2020   Er+ PR+ carcinoma of breast, left ([place]) 06/07/2020   Vitamin D deficiency 04/11/2019   Use of letrozole ([place]) 04/11/2019   Osteopenia 12/26/2018   Malignant neoplasm of upper-outer quadrant  of left breast in female, estrogen receptor positive ([place]) 03/03/2017   1. OSA (obstructive sleep apnea) PLAN OSA:   Patient evaluation suggests high risk of sleep disordered breathing due to witnessed apnea, snoring, excessive daytime sleepiness, brain fog, morning headaches.  Suggest: PSG  to assess/treat the patient's sleep disordered breathing. The patient was also counselled on weight loss to optimize sleep health.   2. Morbid obesity ([place]) Obesity Counseling: Had a lengthy discussion regarding patients BMI and weight issues. Patient was instructed on portion control as well as increased activity. Also discussed caloric restrictions with trying to maintain intake less than 2000 Kcal. Discussions were made in accordance with the 5As of weight management. Simple actions such as not eating late and if able to, taking a walk is suggested.     General Counseling: I have discussed the findings of the evaluation and examination with Nyashia.  I have also discussed any further diagnostic evaluation thatmay be needed or ordered today. Camren verbalizes understanding of the findings of todays visit. We also reviewed her medications today and discussed drug interactions and side effects including but not limited excessive drowsiness and altered mental states. We also discussed that there is always a risk not just to her but also people around her. she has been encouraged to call the office with any questions or concerns that should arise related to todays visit.  No orders of the defined types were placed in this encounter.       I have personally obtained a history, evaluated the patient, evaluated pertinent data, formulated the assessment and  plan and placed orders.  This patient was seen today by Tressie Ellis, PA-C in collaboration with Dr. Devona Konig.    Allyne Gee, MD [facility] Diplomate ABMS Pulmonary and Critical Care Medicine Sleep medicine

## 2022-12-26 ENCOUNTER — Encounter: Payer: Self-pay | Admitting: Oncology

## 2022-12-27 ENCOUNTER — Ambulatory Visit
Admission: EM | Admit: 2022-12-27 | Discharge: 2022-12-27 | Disposition: A | Discharge status: Home / Self Care | Payer: Medicare Other | Attending: Emergency Medicine | Admitting: Emergency Medicine

## 2022-12-27 ENCOUNTER — Encounter: Payer: Self-pay | Admitting: Oncology

## 2022-12-27 DIAGNOSIS — J329 Chronic sinusitis, unspecified: Secondary | ICD-10-CM

## 2022-12-27 DIAGNOSIS — J302 Other seasonal allergic rhinitis: Secondary | ICD-10-CM | POA: Diagnosis not present

## 2022-12-27 MED ORDER — PREDNISONE 10 MG (21) PO TBPK: ORAL_TABLET | Freq: Every day | ORAL | 0 refills | Status: DC

## 2022-12-27 NOTE — ED Triage Notes (Signed)
Patient presents to [facility] for nasal congestion, bilateral ear pain, facial pressure, and head pressure since last Thursday. Taking Flonase, sudafed. Concerned with sinus infection.  Denies fever.

## 2022-12-27 NOTE — ED Provider Notes (Signed)
[facility]    CSN: [id number] Arrival date & time: 12/27/22  1045      History   Chief Complaint Chief Complaint  Patient presents with   Nasal Congestion   Headache   Otalgia    Left ear     HPI Norma Austin is a 62 y.o. female.  Patient presents with 3-day history of ear pain, sinus pressure, congestion, postnasal drip.  Treatment attempted with OTC cold medicine and Flonase.  No fever, cough, shortness of breath, chest pain, or other symptoms.  Medical history includes breast cancer and morbid obesity.  The history is provided by the patient and medical records.    Past Medical History:  Diagnosis Date   Arthritis    Breast cancer ([facility]) 2018   INVASIVE MAMMARY CARCINOMA, T1c, N0, 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   GERD (gastroesophageal reflux disease)    Indigestion    Osteopenia 12/26/2018   Personal history of radiation therapy 2018   F/U left breast cancer   Plantar fasciitis     Patient Active Problem List   Diagnosis Date Noted   OSA (obstructive sleep apnea) 09/28/2022   Morbid obesity ([facility]) 09/28/2022   Aromatase inhibitor use 06/07/2020   Er+ PR+ carcinoma of breast, left ([facility]) 06/07/2020   Vitamin D deficiency 04/11/2019   Use of letrozole ([place]) 04/11/2019   Osteopenia 12/26/2018   Malignant neoplasm of upper-outer quadrant of left breast in female, estrogen receptor positive ([facility]) 03/03/2017    Past Surgical History:  Procedure Laterality Date   BREAST BIOPSY Left 02/24/2017   INVASIVE MAMMARY CARCINOMA   BREAST LUMPECTOMY Left 2018   Invasive, F/U with radiation and Letrozole    BREAST LUMPECTOMY WITH SENTINEL LYMPH NODE BIOPSY Left 03/18/2017   Procedure: BREAST LUMPECTOMY WITH SENTINEL LYMPH NODE BX;  Surgeon: Kieth Brightly, MD;  Location: [facility] ORS;  Service: General;  Laterality: Left;   BREAST MAMMOSITE Left 03/2017   GANGLION CYST EXCISION  1990   MOUTH SURGERY     4 BOTTOM FRONT DENTAL IMPLANTS    OB  History     Gravida  0   Para  0   Term  0   Preterm  0   AB  0   Living  0      SAB  0   IAB  0   Ectopic  0   Multiple  0   Live Births  0        Obstetric Comments  1st Menstrual Cycle:  10             Home Medications    Prior to Admission medications   Medication Sig Start Date End Date Taking? Authorizing Provider  predniSONE (STERAPRED UNI-PAK 21 TAB) 10 MG (21) TBPK tablet Take by mouth daily. As directed 12/27/22  Yes Mickie Bail, NP  acetaminophen (TYLENOL) 500 MG tablet Take 1,000 mg by mouth every 6 (six) hours as needed (for pain.).    [provider]  celecoxib (CELEBREX) 100 MG capsule Take 100 mg by mouth 2 (two) times daily. 11/07/21   [provider]  ergocalciferol (VITAMIN D2) 1.25 MG (50000 UT) capsule Take 1 capsule (50,000 Units total) by mouth once a week. 06/27/22   Rickard Patience, MD  loratadine (CLARITIN) 10 MG tablet Take 10 mg by mouth daily.    [provider]  Multiple Vitamins-Minerals (CENTRUM SILVER 50+WOMEN PO) Take by mouth.    [provider]  rosuvastatin (CRESTOR) 10 MG tablet Take 10 mg by mouth daily. 12/09/18   [provider]    Family History Family History  Problem Relation Age of Onset   Breast cancer Mother 25   CVA Mother    Lung cancer Father    Thyroid cancer Cousin    Colon cancer Neg Hx     Social History Social History   Tobacco Use   Smoking status: Former    Packs/day: 1.00    Years: 25.00    Additional pack years: 0.00    Total pack years: 25.00    Types: Cigarettes    Quit date: 03/12/2007    Years since quitting: 15.8   Smokeless tobacco: Never  Vaping Use   Vaping Use: Never used  Substance Use Topics   Alcohol use: Yes    Alcohol/week: 0.0 standard drinks of alcohol    Comment: occasional   Drug use: No     Allergies   Adhesive [tape]   Review of Systems Review of Systems  Constitutional:  Negative for chills and fever.  HENT:   Positive for congestion, ear pain, postnasal drip, rhinorrhea and sinus pressure. Negative for sore throat.   Respiratory:  Negative for cough and shortness of breath.   Cardiovascular:  Negative for chest pain and palpitations.  Gastrointestinal:  Negative for diarrhea and vomiting.  All other systems reviewed and are negative.    Physical Exam Triage Vital Signs ED Triage Vitals  Enc Vitals Group     BP 12/27/22 1150 (!) 148/84     Pulse Rate 12/27/22 1150 93     Resp 12/27/22 1150 18     Temp 12/27/22 1150 98 F (36.7 C)     Temp Source 12/27/22 1150 Oral     SpO2 12/27/22 1150 94 %     Weight --      Height --      Head Circumference --      Peak Flow --      Pain Score 12/27/22 1149 3     Pain Loc --      Pain Edu? --      Excl. in GC? --    No data found.  Updated Vital Signs BP (!) 148/84 (BP Location: Left Arm)   Pulse 93   Temp 98 F (36.7 C) (Oral)   Resp 18   LMP 03/19/2011 (Approximate) Comment: LMP 2011 or 2012  SpO2 94%   Visual Acuity Right Eye Distance:   Left Eye Distance:   Bilateral Distance:    Right Eye Near:   Left Eye Near:    Bilateral Near:     Physical Exam Vitals and nursing note reviewed.  Constitutional:      General: She is not in acute distress.    Appearance: She is well-developed. She is obese. She is not ill-appearing.  HENT:     Right Ear: Tympanic membrane normal.     Left Ear: Tympanic membrane normal.     Nose: Congestion and rhinorrhea present.     Mouth/Throat:     Mouth: Mucous membranes are moist.     Pharynx: Oropharynx is clear.  Eyes:     Conjunctiva/sclera: Conjunctivae normal.  Cardiovascular:     Rate and Rhythm: Normal rate and regular rhythm.     Heart sounds: Normal heart sounds.  Pulmonary:     Effort: Pulmonary effort is normal. No respiratory distress.     Breath sounds: Normal breath sounds.  Musculoskeletal:  Cervical back: Neck supple.  Skin:    General: Skin is warm and dry.   Neurological:     Mental Status: She is alert.  Psychiatric:        Mood and Affect: Mood normal.        Behavior: Behavior normal.      UC Treatments / Results  Labs (all labs ordered are listed, but only abnormal results are displayed) Labs Reviewed - No data to display  EKG   Radiology No results found.  Procedures Procedures (including critical care time)  Medications Ordered in UC Medications - No data to display  Initial Impression / Assessment and Plan / UC Course  I have reviewed the triage vital signs and the nursing notes.  Pertinent labs & imaging results that were available during my care of the patient were reviewed by me and considered in my medical decision making (see chart for details).    Rhinosinusitis, seasonal allergies.  Afebrile and vital signs are stable.  Patient has been symptomatic for 3 days.  Treating today with prednisone taper.  Discussed plain Mucinex and Tylenol also.  Instructed patient to follow up with her PCP if her symptoms are not improving.  She agrees to plan of care.    Final Clinical Impressions(s) / UC Diagnoses   Final diagnoses:  Rhinosinusitis  Seasonal allergies     Discharge Instructions      Take the prednisone as directed.  Take plain Mucinex and Tylenol as directed.  Follow up with your primary care provider if your symptoms are not improving.        ED Prescriptions     Medication Sig Dispense Auth. Provider   predniSONE (STERAPRED UNI-PAK 21 TAB) 10 MG (21) TBPK tablet Take by mouth daily. As directed 21 tablet Mickie Bail, NP      PDMP not reviewed this encounter.   Mickie Bail, NP 12/27/22 [phone]

## 2022-12-27 NOTE — Discharge Instructions (Addendum)
Take the prednisone as directed.  Take plain Mucinex and Tylenol as directed.  Follow up with your primary care provider if your symptoms are not improving.

## 2022-12-28 ENCOUNTER — Ambulatory Visit: Payer: Self-pay

## 2023-01-19 ENCOUNTER — Inpatient Hospital Stay (HOSPITAL_BASED_OUTPATIENT_CLINIC_OR_DEPARTMENT_OTHER): Payer: Medicare Other | Admitting: Oncology

## 2023-01-19 ENCOUNTER — Encounter: Payer: Self-pay | Admitting: Oncology

## 2023-01-19 ENCOUNTER — Inpatient Hospital Stay: Payer: Medicare Other | Attending: Oncology

## 2023-01-19 ENCOUNTER — Inpatient Hospital Stay: Payer: Medicare Other

## 2023-01-19 VITALS — BP 134/66 | HR 78 | Temp 97.9°F | Wt 276.7 lb

## 2023-01-19 DIAGNOSIS — E559 Vitamin D deficiency, unspecified: Secondary | ICD-10-CM | POA: Diagnosis not present

## 2023-01-19 DIAGNOSIS — C50412 Malignant neoplasm of upper-outer quadrant of left female breast: Secondary | ICD-10-CM | POA: Diagnosis not present

## 2023-01-19 DIAGNOSIS — Z170 Estrogen receptor positive status [ER+]: Secondary | ICD-10-CM | POA: Diagnosis not present

## 2023-01-19 DIAGNOSIS — M8580 Other specified disorders of bone density and structure, unspecified site: Secondary | ICD-10-CM

## 2023-01-19 DIAGNOSIS — Z1231 Encounter for screening mammogram for malignant neoplasm of breast: Secondary | ICD-10-CM | POA: Diagnosis not present

## 2023-01-19 LAB — COMPREHENSIVE METABOLIC PANEL
ALT: 34 U/L (ref 0–44)
AST: 28 U/L (ref 15–41)
Albumin: 3.9 g/dL (ref 3.5–5.0)
Alkaline Phosphatase: 45 U/L (ref 38–126)
Anion gap: 9 (ref 5–15)
BUN: 11 mg/dL (ref 8–23)
CO2: 23 mmol/L (ref 22–32)
Calcium: 8.7 mg/dL — ABNORMAL LOW (ref 8.9–10.3)
Chloride: 104 mmol/L (ref 98–111)
Creatinine, Ser: 0.77 mg/dL (ref 0.44–1.00)
GFR, Estimated: >60 mL/min (ref >60)
Glucose, Bld: 125 mg/dL — ABNORMAL HIGH (ref 70–99)
Potassium: 3.8 mmol/L (ref 3.5–5.1)
Sodium: 136 mmol/L (ref 135–145)
Total Bilirubin: 0.5 mg/dL (ref 0.3–1.2)
Total Protein: 7.2 g/dL (ref 6.5–8.1)

## 2023-01-19 LAB — CBC WITH DIFFERENTIAL/PLATELET
Abs Immature Granulocytes: 0.01 10*3/uL (ref 0.00–0.07)
Basophils Absolute: 0.1 10*3/uL (ref 0.0–0.1)
Basophils Relative: 1 %
Eosinophils Absolute: 0.2 10*3/uL (ref 0.0–0.5)
Eosinophils Relative: 4 %
HCT: 39.4 % (ref 36.0–46.0)
Hemoglobin: 13.6 g/dL (ref 12.0–15.0)
Immature Granulocytes: 0 %
Lymphocytes Relative: 24 %
Lymphs Abs: 1.4 10*3/uL (ref 0.7–4.0)
MCH: 30 pg (ref 26.0–34.0)
MCHC: 34.5 g/dL (ref 30.0–36.0)
MCV: 86.8 fL (ref 80.0–100.0)
Monocytes Absolute: 0.4 10*3/uL (ref 0.1–1.0)
Monocytes Relative: 7 %
Neutro Abs: 3.7 10*3/uL (ref 1.7–7.7)
Neutrophils Relative %: 64 %
Platelets: 175 10*3/uL (ref 150–400)
RBC: 4.54 MIL/uL (ref 3.87–5.11)
RDW: 13.8 % (ref 11.5–15.5)
WBC: 5.9 10*3/uL (ref 4.0–10.5)
nRBC: 0 % (ref 0.0–0.2)

## 2023-01-19 MED ORDER — SODIUM CHLORIDE 0.9 % IV SOLN
INTRAVENOUS | Status: DC | PRN
  Filled 2023-01-19: qty 250

## 2023-01-19 MED ORDER — ZOLEDRONIC ACID 4 MG/100ML IV SOLN
4.0000 mg | Freq: Once | INTRAVENOUS | Status: AC
  Administered 2023-01-19: 4 mg via INTRAVENOUS
  Filled 2023-01-19: qty 100

## 2023-01-19 NOTE — Progress Notes (Signed)
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  Clinically she is doing very well Today's physical examination did not reveal any suspicious lymphadenopathy or breast mass. September 2023 bilateral screening mammogram negative.  She has finished 5 years of letrozole 2.5 mg daily Continue annual mammogram- Sept 2024  Osteopenia # Osteopenia, continue calcium and vitamin D supplementation  Zometa treatments every 6 months.  DEXA every 2 years next due May 2025  Vitamin D deficiency Continue Vitamin D supplementation.    Orders Placed This Encounter  Procedures   MM 3D SCREENING MAMMOGRAM BILATERAL BREAST    Standing Status:   Future    Standing Expiration Date:   01/19/2024    Order Specific Question:   Reason for Exam (SYMPTOM  OR DIAGNOSIS REQUIRED)    Answer:   hx breast cancer    Order Specific Question:   Preferred imaging location?    Answer:   [facility]   CBC with Differential (Cancer Center Only)    Standing Status:   Future    Standing Expiration Date:   01/19/2024   CMP (Cancer Center only)    Standing Status:   Future    Standing Expiration Date:   01/19/2024   Follow up  6 months  lab MD + Zometa   All questions were answered. The patient knows to call the clinic with any problems, questions or concerns.  Rickard Patience, MD, PhD [facility] Hematology Oncology 01/19/2023     REASON FOR VISIT Follow up for treatment of Stage IA  ER+ PR+ breast cancer  HISTORY OF PRESENTING ILLNESS: 1 Norma Austin 62 y.o. female presents for follow up of Stage IA  ER+ PR+ breast cancer .  Patient had routine mammogram screening which showed left breast mass. This was followed by a diagnostic Mammogram and ultrasound which showed an hypoechoic mass  measuring 12 x 7 x 10 mm at 1:30, 7 cm from the nipple, corresponding to thespiculated mass seen mammographically. There is an adjacent near anechoic mass at 1 o'clock, 6 cm from the nipple measuring 6 x 4 x 4 mm. 1:30 mass was biopsy and showed invasive carcinoma of breast, unspecified type, no LVI, No DCIS. The 1 o'clock mass disappeared after biopsy of the 1:30 mass. It maybe a cyst which was poked by the needle for anesthesia.   2 Pathology: 02/24/2017 US guided breast mass biopsy showed invasive mammary carcinoma, grade 1, ER 90% PR90%, HER2 Equivocal by IHC, negative by FISH. S/p left breast lumpectomy and sentinel lymph node biopsy on 03/18/2017. Pathology showed 15mm invasive mammary carcinoma of no specific type, margins are all negative, and all 3 sentinel lymph nodes were negative. ER/PR 90%, HER2 FISH negative. Mammaprint showed low risk.  3  lumpectomy followed by MammoSite radiation. Mammaprint indicates low risk. Adjuvant chemotherapy is not offered.   Interval History:  Patient with above oncology history reviewed by me present for follow-up management of stage IA ER/PR positive HER 2 negative breast cancer. She is off letrozole, finished 5 years.   Overall she tolerates well No new complaints. No new breast concerns.  New diagnosis of OSA, on APAP   Review of Systems  Constitutional:  Negative for chills, fever, malaise/fatigue and weight loss.  HENT:  Negative for sore throat.   Eyes:  Negative for redness.  Respiratory:  Negative for cough, shortness of breath and wheezing.   Cardiovascular:  Negative for chest pain, palpitations and leg swelling.  Gastrointestinal:  Negative for abdominal pain, blood in stool, nausea and vomiting.  Genitourinary:  Negative for dysuria.  Musculoskeletal:  Positive for joint pain. Negative for myalgias.  Skin:  Negative for rash.  Neurological:  Negative for dizziness, tingling and tremors.  Endo/Heme/Allergies:  Does not bruise/bleed easily.   Psychiatric/Behavioral:  Negative for hallucinations.     MEDICAL HISTORY: Past Medical History:  Diagnosis Date   Arthritis    Breast cancer (HCC) 2018   INVASIVE MAMMARY CARCINOMA, T1c, N0, 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   GERD (gastroesophageal reflux disease)    Indigestion    Osteopenia 12/26/2018   Personal history of radiation therapy 2018   F/U left breast cancer   Plantar fasciitis     SURGICAL HISTORY: Past Surgical History:  Procedure Laterality Date   BREAST BIOPSY Left 02/24/2017   INVASIVE MAMMARY CARCINOMA   BREAST LUMPECTOMY Left 2018   Invasive, F/U with radiation and Letrozole    BREAST LUMPECTOMY WITH SENTINEL LYMPH NODE BIOPSY Left 03/18/2017   Procedure: BREAST LUMPECTOMY WITH SENTINEL LYMPH NODE BX;  Surgeon: Kieth Brightly, MD;  Location: [facility] ORS;  Service: General;  Laterality: Left;   BREAST MAMMOSITE Left 03/2017   GANGLION CYST EXCISION  1990   MOUTH SURGERY     4 BOTTOM FRONT DENTAL IMPLANTS    SOCIAL HISTORY: Social History   Socioeconomic History   Marital status: Married    Spouse name: Not on file   Number of children: Not on file   Years of education: Not on file   Highest education level: Not on file  Occupational History   Not on file  Tobacco Use   Smoking status: Former    Packs/day: 1.00    Years: 25.00    Additional pack years: 0.00    Total pack years: 25.00    Types: Cigarettes    Quit date: 03/12/2007    Years since quitting: 15.8   Smokeless tobacco: Never  Vaping Use   Vaping Use: Never used  Substance and Sexual Activity   Alcohol use: Yes    Alcohol/week: 0.0 standard drinks of alcohol    Comment: occasional   Drug use: No   Sexual activity: Not on file  Other Topics Concern   Not on file  Social History Narrative   Not on file   Social Determinants of Health   Financial Resource Strain: Not on file  Food Insecurity: Not on file  Transportation Needs: Not on file  Physical Activity: Not  on file  Stress: Not on file  Social Connections: Not on file  Intimate Partner Violence: Not on file    FAMILY HISTORY Family History  Problem Relation Age of Onset   Breast cancer Mother 25   CVA Mother    Lung cancer Father    Thyroid cancer Cousin    Colon cancer Neg Hx     ALLERGIES:  is allergic to adhesive [tape].  MEDICATIONS:  Current Outpatient Medications  Medication Sig Dispense Refill   acetaminophen (TYLENOL) 500 MG tablet Take 1,000 mg by mouth every 6 (six) hours as needed (for pain.).     celecoxib (CELEBREX) 100 MG capsule Take 100 mg by mouth 2 (two) times daily.     ergocalciferol (VITAMIN D2) 1.25 MG (50000 UT) capsule Take 1 capsule (50,000 Units total) by mouth once a  week. 6 capsule 0   loratadine (CLARITIN) 10 MG tablet Take 10 mg by mouth daily.     Multiple Vitamins-Minerals (CENTRUM SILVER 50+WOMEN PO) Take by mouth.     rosuvastatin (CRESTOR) 10 MG tablet Take 10 mg by mouth daily.     predniSONE (STERAPRED UNI-PAK 21 TAB) 10 MG (21) TBPK tablet Take by mouth daily. As directed (Patient not taking: Reported on 01/19/2023) 21 tablet 0   No current facility-administered medications for this visit.    PHYSICAL EXAMINATION:  ECOG PERFORMANCE STATUS: 0 - Asymptomatic   Vitals:   01/19/23 1307  BP: 134/66  Pulse: 78  Temp: 97.9 F (36.6 C)  SpO2: 96%    Filed Weights   01/19/23 1307  Weight: 276 lb 11.2 oz (125.5 kg)     Physical Exam Constitutional:      General: She is not in acute distress.    Appearance: She is not diaphoretic.  HENT:     Head: Normocephalic and atraumatic.     Nose: Nose normal.     Mouth/Throat:     Pharynx: No oropharyngeal exudate.  Eyes:     General: No scleral icterus.       Left eye: No discharge.     Pupils: Pupils are equal, round, and reactive to light.  Neck:     Vascular: No JVD.  Cardiovascular:     Rate and Rhythm: Normal rate and regular rhythm.     Heart sounds: Normal heart sounds. No  murmur heard. Pulmonary:     Effort: Pulmonary effort is normal. No respiratory distress.     Breath sounds: Normal breath sounds. No wheezing or rales.  Chest:     Chest wall: No tenderness.  Abdominal:     General: There is no distension.     Palpations: Abdomen is soft. There is no mass.     Tenderness: There is no abdominal tenderness. There is no rebound.  Musculoskeletal:        General: No tenderness. Normal range of motion.     Cervical back: Normal range of motion and neck supple.  Lymphadenopathy:     Cervical: No cervical adenopathy.  Skin:    General: Skin is warm and dry.     Findings: No erythema or rash.  Neurological:     Mental Status: She is alert and oriented to person, place, and time.     Motor: No abnormal muscle tone.  Psychiatric:        Mood and Affect: Affect normal.   Breast exam was performed in seated and lying down position.   Patient is status post lumpectomy of left breast  with a well-healed surgical scar with mild tissue thickening around the lumpectomy site.  No palpable masses bilateral breast  . No palpable bilateral axillary adenopathy.    RADIOGRAPHIC STUDIES: I have personally reviewed the radiological images as listed and agreed with the findings in the report. No results found.

## 2023-01-19 NOTE — Assessment & Plan Note (Signed)
#  Stage IA Breast cancer,-2018 Labs reviewed and discussed with patient.  Clinically she is doing very well Today's physical examination did not reveal any suspicious lymphadenopathy or breast mass. September 2023 bilateral screening mammogram negative.  She has finished 5 years of letrozole 2.5 mg daily Continue annual mammogram- Sept 2024

## 2023-01-19 NOTE — Assessment & Plan Note (Signed)
#   Osteopenia, continue calcium and vitamin D supplementation  Zometa treatments every 6 months.  DEXA every 2 years next due May 2025 

## 2023-01-19 NOTE — Assessment & Plan Note (Signed)
Continue Vitamin D supplementation  

## 2023-03-07 ENCOUNTER — Encounter: Payer: Self-pay | Admitting: Oncology

## 2023-03-08 ENCOUNTER — Ambulatory Visit (INDEPENDENT_AMBULATORY_CARE_PROVIDER_SITE_OTHER): Payer: Medicare Other | Admitting: Internal Medicine

## 2023-03-08 ENCOUNTER — Encounter: Payer: Self-pay | Admitting: Oncology

## 2023-03-08 VITALS — BP 132/88 | HR 60 | Resp 16 | Ht 66.0 in | Wt 253.0 lb

## 2023-03-08 DIAGNOSIS — G4733 Obstructive sleep apnea (adult) (pediatric): Secondary | ICD-10-CM | POA: Diagnosis not present

## 2023-03-08 DIAGNOSIS — Z7189 Other specified counseling: Secondary | ICD-10-CM | POA: Diagnosis not present

## 2023-03-08 NOTE — Progress Notes (Unsigned)
[facility] [address] [place], [place] [address]  Pulmonary Sleep Medicine   Office Visit Note  Patient Name: Norma Austin DOB: [birth year] MRN [id number]    Chief Complaint: Obstructive Sleep Apnea visit  Brief History:  Norma Austin is seen today for follow up 3 months after setup on APAP at 9-18 cmh20. The patient has a 5 month history of sleep apnea. Patient is using PAP nightly.  The patient feels rested after sleeping with PAP.  The patient reports benefiting from PAP use. Reported sleepiness is improved and the Epworth Sleepiness Score is 7 out of 24. The patient rarely take naps. The patient complains of the following: No complaints.  The compliance download shows 93% compliance with an average use time of 5:48 hours. The AHI is 1.2.  The patient does notice some mild restlessness of limb movements disrupting sleep.  ROS  General: (-) fever, (-) chills, (-) night sweat Nose and Sinuses: (-) nasal stuffiness or itchiness, (-) postnasal drip, (-) nosebleeds, (-) sinus trouble. Mouth and Throat: (-) sore throat, (-) hoarseness. Neck: (-) swollen glands, (-) enlarged thyroid, (-) neck pain. Respiratory: - cough, - shortness of breath, - wheezing. Neurologic: - numbness, - tingling. Psychiatric: - anxiety, - depression   Current Medication: Outpatient Encounter Medications as of 03/08/2023  Medication Sig   acetaminophen (TYLENOL) 500 MG tablet Take 1,000 mg by mouth every 6 (six) hours as needed (for pain.).   celecoxib (CELEBREX) 100 MG capsule Take 100 mg by mouth 2 (two) times daily.   ergocalciferol (VITAMIN D2) 1.25 MG (50000 UT) capsule Take 1 capsule (50,000 Units total) by mouth once a week.   loratadine (CLARITIN) 10 MG tablet Take 10 mg by mouth daily.   Multiple Vitamins-Minerals (CENTRUM SILVER 50+WOMEN PO) Take by mouth.   predniSONE (STERAPRED UNI-PAK 21 TAB) 10 MG (21) TBPK tablet Take by mouth daily. As directed (Patient not taking: Reported on  01/19/2023)   rosuvastatin (CRESTOR) 10 MG tablet Take 10 mg by mouth daily.   No facility-administered encounter medications on file as of 03/08/2023.    Surgical History: Past Surgical History:  Procedure Laterality Date   BREAST BIOPSY Left 02/24/2017   INVASIVE MAMMARY CARCINOMA   BREAST LUMPECTOMY Left 2018   Invasive, F/U with radiation and Letrozole    BREAST LUMPECTOMY WITH SENTINEL LYMPH NODE BIOPSY Left 03/18/2017   Procedure: BREAST LUMPECTOMY WITH SENTINEL LYMPH NODE BX;  Surgeon: Kieth Brightly, MD;  Location: [facility] ORS;  Service: General;  Laterality: Left;   BREAST MAMMOSITE Left 03/2017   GANGLION CYST EXCISION  1990   MOUTH SURGERY     4 BOTTOM FRONT DENTAL IMPLANTS    Medical History: Past Medical History:  Diagnosis Date   Arthritis    Breast cancer (HCC) 2018   INVASIVE MAMMARY CARCINOMA, T1c, N0, 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   GERD (gastroesophageal reflux disease)    Indigestion    Osteopenia 12/26/2018   Personal history of radiation therapy 2018   F/U left breast cancer   Plantar fasciitis     Family History: Non contributory to the present illness  Social History: Social History   Socioeconomic History   Marital status: Married    Spouse name: Not on file   Number of children: Not on file   Years of education: Not on file   Highest education level: Not on file  Occupational History   Not on file  Tobacco Use   Smoking status: Former  Current packs/day: 0.00    Average packs/day: 1 pack/day for 25.0 years (25.0 ttl pk-yrs)    Types: Cigarettes    Start date: 03/11/1982    Quit date: 03/12/2007    Years since quitting: 16.0   Smokeless tobacco: Never  Vaping Use   Vaping status: Never Used  Substance and Sexual Activity   Alcohol use: Yes    Alcohol/week: 0.0 standard drinks of alcohol    Comment: occasional   Drug use: No   Sexual activity: Not on file  Other Topics Concern   Not on file  Social History Narrative    Not on file   Social Determinants of Health   Financial Resource Strain: Not on file  Food Insecurity: Not on file  Transportation Needs: Not on file  Physical Activity: Not on file  Stress: Not on file  Social Connections: Not on file  Intimate Partner Violence: Not on file    Vital Signs: Last menstrual period 03/19/2011. There is no height or weight on file to calculate BMI.    Examination: General Appearance: The patient is well-developed, well-nourished, and in no distress. Neck Circumference: 43 cm Skin: Gross inspection of skin unremarkable. Head: normocephalic, no gross deformities. Eyes: no gross deformities noted. ENT: ears appear grossly normal Neurologic: Alert and oriented. No involuntary movements.  STOP BANG RISK ASSESSMENT S (snore) Have you been told that you snore?     YES   T (tired) Are you often tired, fatigued, or sleepy during the day?   NO  O (obstruction) Do you stop breathing, choke, or gasp during sleep? YES   P (pressure) Do you have or are you being treated for high blood pressure? NO   B (BMI) Is your body index greater than 35 kg/m? YES   A (age) Are you 22 years old or older? YES   N (neck) Do you have a neck circumference greater than 16 inches?   YES   G (gender) Are you a female? NO   TOTAL STOP/BANG "YES" ANSWERS 5       A STOP-Bang score of 2 or less is considered low risk, and a score of 5 or more is high risk for having either moderate or severe OSA. For people who score 3 or 4, doctors may need to perform further assessment to determine how likely they are to have OSA.         EPWORTH SLEEPINESS SCALE:  Scale:  (0)= no chance of dozing; (1)= slight chance of dozing; (2)= moderate chance of dozing; (3)= high chance of dozing  Chance  Situtation    Sitting and reading: 1    Watching TV: 1    Sitting Inactive in public: 0    As a passenger in car: 2      Lying down to rest: 1    Sitting and talking: 0    Sitting  quielty after lunch: 2    In a car, stopped in traffic: 0   TOTAL SCORE:   7 out of 24    SLEEP STUDIES:  PSG (10/07/22) AHI 56, REM AHI 71, min SPO2 68%   CPAP COMPLIANCE DATA:  Date Range: 12/02/22 - 12/31/22  Average Daily Use: 5:48 hours  Median Use: 6:13 hours  Compliance for > 4 Hours: 28 days  AHI: 1.2 respiratory events per hour  Days Used: 28/30  Mask Leak: 14.4  95th Percentile Pressure: 16.4 cmh20         LABS: Recent Results (from  the past 2160 hour(s))  Comprehensive metabolic panel     Status: Abnormal   Collection Time: 01/19/23 12:41 PM  Result Value Ref Range   Sodium 136 135 - 145 mmol/L    Comment: Performed at [facility], [address] [place], [place] [address]   Potassium 3.8 3.5 - 5.1 mmol/L    Comment: Performed at [facility], [address] [place], [place] [address]   Chloride 104 98 - 111 mmol/L    Comment: Performed at [facility], [address] [place], [place] [address]   CO2 23 22 - 32 mmol/L    Comment: Performed at [facility], [address] [place], [place] [address]   Glucose, Bld 125 (H) 70 - 99 mg/dL    Comment: Glucose reference range applies only to samples taken after fasting for at least 8 hours. Performed at [facility], [address] [place], [place] [address]    BUN 11 8 - 23 mg/dL    Comment: Performed at [facility], [address] [place], [place] [address]   Creatinine, Ser 0.77 0.44 - 1.00 mg/dL    Comment: Performed at [facility], [address] [place], [place] [address]   Calcium 8.7 (L) 8.9 - 10.3 mg/dL    Comment: Performed at [facility], [address] [place], [place] [address]   Total Protein 7.2 6.5 - 8.1 g/dL    Comment: Performed at [facility], [address] [place], [place] [address]   Albumin 3.9 3.5 - 5.0 g/dL    Comment: Performed at [facility], [address]  [place], [place] [address]   AST 28 15 - 41 U/L    Comment: Performed at [facility], [address] [place], [place] [address]   ALT 34 0 - 44 U/L    Comment: Performed at [facility], [address] [place], [place] [address]   Alkaline Phosphatase 45 38 - 126 U/L    Comment: Performed at [facility], [address] [place], [place] [address]   Total Bilirubin 0.5 0.3 - 1.2 mg/dL    Comment: Performed at [facility], [address] [place], [place] [address]   GFR, Estimated >60 >60 mL/min    Comment: (NOTE) Calculated using the CKD-EPI Creatinine Equation (2021) Performed at [facility], [address] [place], [place] [address]    Anion gap 9 5 - 15    Comment: Performed at [facility], [address] [place], [place] [address]  CBC with Differential/Platelet     Status: None   Collection Time: 01/19/23 12:41 PM  Result Value Ref Range   WBC 5.9 4.0 - 10.5 K/uL   RBC 4.54 3.87 - 5.11 MIL/uL   Hemoglobin 13.6 12.0 - 15.0 g/dL   HCT 09.3 23.5 - 57.3 %   MCV 86.8 80.0 - 100.0 fL   MCH 30.0 26.0 - 34.0 pg   MCHC 34.5 30.0 - 36.0 g/dL   RDW 22.0 25.4 - 27.0 %   Platelets 175 150 - 400 K/uL   nRBC 0.0 0.0 - 0.2 %   Neutrophils Relative % 64 %   Neutro Abs 3.7 1.7 - 7.7 K/uL   Lymphocytes Relative 24 %   Lymphs Abs 1.4 0.7 - 4.0 K/uL   Monocytes Relative 7 %   Monocytes Absolute 0.4 0.1 - 1.0 K/uL   Eosinophils Relative 4 %   Eosinophils Absolute  0.2 0.0 - 0.5 K/uL   Basophils Relative 1 %   Basophils Absolute 0.1 0.0 - 0.1 K/uL   Immature Granulocytes 0 %   Abs Immature Granulocytes 0.01 0.00 - 0.07 K/uL    Comment: Performed at [facility], [address] [place], [place] [address]    Radiology: No results found.  No results found.  No results found.    Assessment and Plan: Patient Active Problem List   Diagnosis Date Noted   OSA (obstructive sleep apnea) 09/28/2022   Morbid obesity (HCC) 09/28/2022    Aromatase inhibitor use 06/07/2020   Er+ PR+ carcinoma of breast, left (HCC) 06/07/2020   Vitamin D deficiency 04/11/2019   Use of letrozole (Femara) 04/11/2019   Osteopenia 12/26/2018   Malignant neoplasm of upper-outer quadrant of left breast in female, estrogen receptor positive (HCC) 03/03/2017   1. OSA (obstructive sleep apnea) The patient does tolerate PAP and reports  benefit from PAP use. The patient was reminded how to clean equipment and advised to replace supplies routinely. The patient was also counselled on weight loss. The compliance is very good. The AHI is 1.2.   OSA on cpap- controlled. Continue with excellent compliance with pap. CPAP continues to be medically necessary to treat this patient's OSA. F/u one year.     2. CPAP use counseling CPAP Counseling: had a lengthy discussion with the patient regarding the importance of PAP therapy in management of the sleep apnea. Patient appears to understand the risk factor reduction and also understands the risks associated with untreated sleep apnea. Patient will try to make a good faith effort to remain compliant with therapy. Also instructed the patient on proper cleaning of the device including the water must be changed daily if possible and use of distilled water is preferred. Patient understands that the machine should be regularly cleaned with appropriate recommended cleaning solutions that do not damage the PAP machine for example given white vinegar and water rinses. Other methods such as ozone treatment may not be as good as these simple methods to achieve cleaning.   3. Morbid obesity (HCC) Obesity Counseling: Had a lengthy discussion regarding patients BMI and weight issues. Patient was instructed on portion control as well as increased activity. Also discussed caloric restrictions with trying to maintain intake less than 2000 Kcal. Discussions were made in accordance with the 5As of weight management. Simple actions such as  not eating late and if able to, taking a walk is suggested.      General Counseling: I have discussed the findings of the evaluation and examination with Norma Austin.  I have also discussed any further diagnostic evaluation thatmay be needed or ordered today. Norma Austin verbalizes understanding of the findings of todays visit. We also reviewed her medications today and discussed drug interactions and side effects including but not limited excessive drowsiness and altered mental states. We also discussed that there is always a risk not just to her but also people around her. she has been encouraged to call the office with any questions or concerns that should arise related to todays visit.  No orders of the defined types were placed in this encounter.       I have personally obtained a history, examined the patient, evaluated laboratory and imaging results, formulated the assessment and plan and placed orders. This patient was seen today by Emmaline Kluver, PA-C in collaboration with Dr. Freda Munro.   Yevonne Pax, MD [facility] Diplomate ABMS Pulmonary Critical Care Medicine and Sleep  Medicine

## 2023-03-08 NOTE — Patient Instructions (Signed)

## 2023-04-19 ENCOUNTER — Encounter: Payer: Self-pay | Admitting: Oncology

## 2023-04-26 ENCOUNTER — Ambulatory Visit
Admission: RE | Admit: 2023-04-26 | Discharge: 2023-04-26 | Disposition: A | Discharge status: Home / Self Care | Payer: Medicare Other | Source: Ambulatory Visit | Attending: Oncology | Admitting: Oncology

## 2023-04-26 DIAGNOSIS — Z170 Estrogen receptor positive status [ER+]: Secondary | ICD-10-CM | POA: Insufficient documentation

## 2023-04-26 DIAGNOSIS — C50412 Malignant neoplasm of upper-outer quadrant of left female breast: Secondary | ICD-10-CM | POA: Diagnosis present

## 2023-04-26 DIAGNOSIS — Z1231 Encounter for screening mammogram for malignant neoplasm of breast: Secondary | ICD-10-CM | POA: Insufficient documentation

## 2023-07-21 ENCOUNTER — Encounter: Payer: Self-pay | Admitting: Oncology

## 2023-07-21 ENCOUNTER — Inpatient Hospital Stay: Payer: Medicare Other

## 2023-07-21 ENCOUNTER — Inpatient Hospital Stay: Payer: Medicare Other | Attending: Oncology

## 2023-07-21 ENCOUNTER — Inpatient Hospital Stay (HOSPITAL_BASED_OUTPATIENT_CLINIC_OR_DEPARTMENT_OTHER): Payer: Medicare Other | Admitting: Oncology

## 2023-07-21 VITALS — BP 131/73 | HR 62 | Temp 97.9°F | Resp 19

## 2023-07-21 VITALS — BP 132/74 | HR 70 | Temp 97.6°F | Resp 18 | Wt 231.6 lb

## 2023-07-21 DIAGNOSIS — M8580 Other specified disorders of bone density and structure, unspecified site: Secondary | ICD-10-CM | POA: Diagnosis present

## 2023-07-21 DIAGNOSIS — C50412 Malignant neoplasm of upper-outer quadrant of left female breast: Secondary | ICD-10-CM

## 2023-07-21 DIAGNOSIS — E559 Vitamin D deficiency, unspecified: Secondary | ICD-10-CM | POA: Diagnosis not present

## 2023-07-21 DIAGNOSIS — Z79811 Long term (current) use of aromatase inhibitors: Secondary | ICD-10-CM

## 2023-07-21 DIAGNOSIS — Z170 Estrogen receptor positive status [ER+]: Secondary | ICD-10-CM | POA: Diagnosis not present

## 2023-07-21 DIAGNOSIS — Z79899 Other long term (current) drug therapy: Secondary | ICD-10-CM | POA: Insufficient documentation

## 2023-07-21 LAB — CMP (CANCER CENTER ONLY)
ALT: 18 U/L (ref 0–44)
AST: 16 U/L (ref 15–41)
Albumin: 4 g/dL (ref 3.5–5.0)
Alkaline Phosphatase: 39 U/L (ref 38–126)
Anion gap: 8 (ref 5–15)
BUN: 17 mg/dL (ref 8–23)
CO2: 26 mmol/L (ref 22–32)
Calcium: 9.1 mg/dL (ref 8.9–10.3)
Chloride: 105 mmol/L (ref 98–111)
Creatinine: 1.06 mg/dL — ABNORMAL HIGH (ref 0.44–1.00)
GFR, Estimated: 59 mL/min — ABNORMAL LOW (ref >60)
Glucose, Bld: 99 mg/dL (ref 70–99)
Potassium: 4.3 mmol/L (ref 3.5–5.1)
Sodium: 139 mmol/L (ref 135–145)
Total Bilirubin: 0.6 mg/dL (ref <1.2)
Total Protein: 7.3 g/dL (ref 6.5–8.1)

## 2023-07-21 LAB — CBC WITH DIFFERENTIAL (CANCER CENTER ONLY)
Abs Immature Granulocytes: 0.03 10*3/uL (ref 0.00–0.07)
Basophils Absolute: 0.1 10*3/uL (ref 0.0–0.1)
Basophils Relative: 1 %
Eosinophils Absolute: 0.2 10*3/uL (ref 0.0–0.5)
Eosinophils Relative: 2 %
HCT: 40.4 % (ref 36.0–46.0)
Hemoglobin: 13.7 g/dL (ref 12.0–15.0)
Immature Granulocytes: 0 %
Lymphocytes Relative: 24 %
Lymphs Abs: 1.8 10*3/uL (ref 0.7–4.0)
MCH: 30.2 pg (ref 26.0–34.0)
MCHC: 33.9 g/dL (ref 30.0–36.0)
MCV: 89 fL (ref 80.0–100.0)
Monocytes Absolute: 0.4 10*3/uL (ref 0.1–1.0)
Monocytes Relative: 6 %
Neutro Abs: 4.9 10*3/uL (ref 1.7–7.7)
Neutrophils Relative %: 67 %
Platelet Count: 171 10*3/uL (ref 150–400)
RBC: 4.54 MIL/uL (ref 3.87–5.11)
RDW: 13.1 % (ref 11.5–15.5)
WBC Count: 7.4 10*3/uL (ref 4.0–10.5)
nRBC: 0 % (ref 0.0–0.2)

## 2023-07-21 MED ORDER — ZOLEDRONIC ACID 4 MG/5ML IV CONC
3.3000 mg | Freq: Once | INTRAVENOUS | Status: AC
  Administered 2023-07-21: 3.3 mg via INTRAVENOUS
  Filled 2023-07-21: qty 4.13

## 2023-07-21 MED ORDER — SODIUM CHLORIDE 0.9 % IV SOLN
Freq: Once | INTRAVENOUS | Status: AC
  Filled 2023-07-21: qty 250

## 2023-07-21 NOTE — Progress Notes (Signed)
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  Clinically she is doing very well She has finished 5 years of letrozole 2.5 mg daily, currently off Continue annual mammogram- Sept 2024 mammogram results discussed with patient.  Osteopenia # Osteopenia, continue calcium and vitamin D supplementation  Zometa treatments every 6 months.  DEXA every 2 years next due May 2025  Vitamin D deficiency Continue Vitamin D supplementation.    Orders Placed This Encounter  Procedures   DG Bone Density    Standing Status:   Future    Standing Expiration Date:   07/20/2024    Order Specific Question:   Reason for Exam (SYMPTOM  OR DIAGNOSIS REQUIRED)    Answer:   Breast cancer, osteopenia    Order Specific Question:   Preferred imaging location?    Answer:   [facility]   CMP (Cancer Center only)    Standing Status:   Future    Standing Expiration Date:   07/20/2024   CBC with Differential (Cancer Center Only)    Standing Status:   Future    Standing Expiration Date:   07/20/2024   Follow up  6 months  lab MD + Zometa   All questions were answered. The patient knows to call the clinic with any problems, questions or concerns.  Rickard Patience, MD, PhD [facility] Hematology Oncology 07/21/2023     REASON FOR VISIT Follow up for treatment of Stage IA  ER+ PR+ breast cancer  HISTORY OF PRESENTING ILLNESS: 1 Norma Austin 62 y.o. female presents for follow up of Stage IA  ER+ PR+ breast cancer .  Patient had routine mammogram screening which showed left breast mass. This was followed by a diagnostic Mammogram and ultrasound which showed an hypoechoic mass measuring 12 x 7 x 10 mm at 1:30, 7 cm from the nipple, corresponding to thespiculated mass seen  mammographically. There is an adjacent near anechoic mass at 1 o'clock, 6 cm from the nipple measuring 6 x 4 x 4 mm. 1:30 mass was biopsy and showed invasive carcinoma of breast, unspecified type, no LVI, No DCIS. The 1 o'clock mass disappeared after biopsy of the 1:30 mass. It maybe a cyst which was poked by the needle for anesthesia.   2 Pathology: 02/24/2017 US guided breast mass biopsy showed invasive mammary carcinoma, grade 1, ER 90% PR90%, HER2 Equivocal by IHC, negative by FISH. S/p left breast lumpectomy and sentinel lymph node biopsy on 03/18/2017. Pathology showed 15mm invasive mammary carcinoma of no specific type, margins are all negative, and all 3 sentinel lymph nodes were negative. ER/PR 90%, HER2 FISH negative. Mammaprint showed low risk.  3  lumpectomy followed by MammoSite radiation. Mammaprint indicates low risk. Adjuvant chemotherapy is not offered.   Interval History:  Patient with above oncology history reviewed by me present for follow-up management of stage IA ER/PR positive HER 2 negative breast cancer. She is off letrozole, finished 5 years.   Overall she tolerates well No new complaints. No new breast concerns.  She has intentionally lost weight due to increased activity as well as healthier diet.   Review of Systems  Constitutional:  Negative for chills, fever, malaise/fatigue and weight loss.  HENT:  Negative for sore throat.   Eyes:  Negative for redness.  Respiratory:  Negative for cough, shortness of  breath and wheezing.   Cardiovascular:  Negative for chest pain, palpitations and leg swelling.  Gastrointestinal:  Negative for abdominal pain, blood in stool, nausea and vomiting.  Genitourinary:  Negative for dysuria.  Musculoskeletal:  Positive for joint pain. Negative for myalgias.  Skin:  Negative for rash.  Neurological:  Negative for dizziness, tingling and tremors.  Endo/Heme/Allergies:  Does not bruise/bleed easily.  Psychiatric/Behavioral:  Negative for  hallucinations.     MEDICAL HISTORY: Past Medical History:  Diagnosis Date   Arthritis    Breast cancer (HCC) 2018   INVASIVE MAMMARY CARCINOMA, T1c, N0, 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   GERD (gastroesophageal reflux disease)    Indigestion    Osteopenia 12/26/2018   Personal history of radiation therapy 2018   F/U left breast cancer   Plantar fasciitis     SURGICAL HISTORY: Past Surgical History:  Procedure Laterality Date   BREAST BIOPSY Left 02/24/2017   INVASIVE MAMMARY CARCINOMA   BREAST LUMPECTOMY Left 2018   Invasive, F/U with radiation and Letrozole    BREAST LUMPECTOMY WITH SENTINEL LYMPH NODE BIOPSY Left 03/18/2017   Procedure: BREAST LUMPECTOMY WITH SENTINEL LYMPH NODE BX;  Surgeon: Kieth Brightly, MD;  Location: [facility] ORS;  Service: General;  Laterality: Left;   BREAST MAMMOSITE Left 03/2017   GANGLION CYST EXCISION  1990   MOUTH SURGERY     4 BOTTOM FRONT DENTAL IMPLANTS    SOCIAL HISTORY: Social History   Socioeconomic History   Marital status: Married    Spouse name: Not on file   Number of children: Not on file   Years of education: Not on file   Highest education level: Not on file  Occupational History   Not on file  Tobacco Use   Smoking status: Former    Current packs/day: 0.00    Average packs/day: 1 pack/day for 25.0 years (25.0 ttl pk-yrs)    Types: Cigarettes    Start date: 03/11/1982    Quit date: 03/12/2007    Years since quitting: 16.3   Smokeless tobacco: Never  Vaping Use   Vaping status: Never Used  Substance and Sexual Activity   Alcohol use: Yes    Alcohol/week: 0.0 standard drinks of alcohol    Comment: occasional   Drug use: No   Sexual activity: Not on file  Other Topics Concern   Not on file  Social History Narrative   Not on file   Social Determinants of Health   Financial Resource Strain: Not on file  Food Insecurity: Not on file  Transportation Needs: Not on file  Physical Activity: Not on file   Stress: Not on file  Social Connections: Not on file  Intimate Partner Violence: Not on file    FAMILY HISTORY Family History  Problem Relation Age of Onset   Breast cancer Mother 60   CVA Mother    Lung cancer Father    Thyroid cancer Cousin    Colon cancer Neg Hx     ALLERGIES:  is allergic to adhesive [tape].  MEDICATIONS:  Current Outpatient Medications  Medication Sig Dispense Refill   acetaminophen (TYLENOL) 500 MG tablet Take 1,000 mg by mouth every 6 (six) hours as needed (for pain.).     celecoxib (CELEBREX) 100 MG capsule Take 100 mg by mouth 2 (two) times daily.     ergocalciferol (VITAMIN D2) 1.25 MG (50000 UT) capsule Take 1 capsule (50,000 Units total) by mouth once a week. 6 capsule 0  loratadine (CLARITIN) 10 MG tablet Take 10 mg by mouth daily.     Multiple Vitamins-Minerals (CENTRUM SILVER 50+WOMEN PO) Take by mouth.     rosuvastatin (CRESTOR) 10 MG tablet Take 10 mg by mouth daily.     XENICAL 120 MG capsule Take 120 mg by mouth 3 (three) times daily.     No current facility-administered medications for this visit.    PHYSICAL EXAMINATION:  ECOG PERFORMANCE STATUS: 0 - Asymptomatic   Vitals:   07/21/23 1319  BP: 132/74  Pulse: 70  Resp: 18  Temp: 97.6 F (36.4 C)  SpO2: 99%    Filed Weights   07/21/23 1319  Weight: 231 lb 9.6 oz (105.1 kg)     Physical Exam Constitutional:      General: She is not in acute distress.    Appearance: She is not diaphoretic.  HENT:     Head: Normocephalic and atraumatic.  Eyes:     General: No scleral icterus.       Left eye: No discharge.  Neck:     Vascular: No JVD.  Cardiovascular:     Rate and Rhythm: Normal rate and regular rhythm.     Heart sounds: Normal heart sounds. No murmur heard. Pulmonary:     Effort: Pulmonary effort is normal. No respiratory distress.     Breath sounds: Normal breath sounds. No wheezing.  Abdominal:     General: Bowel sounds are normal. There is no distension.      Palpations: Abdomen is soft.  Musculoskeletal:        General: No tenderness. Normal range of motion.     Cervical back: Normal range of motion and neck supple.  Lymphadenopathy:     Cervical: No cervical adenopathy.  Skin:    General: Skin is warm and dry.     Findings: No erythema or rash.  Neurological:     Mental Status: She is alert and oriented to person, place, and time. Mental status is at baseline.     Motor: No abnormal muscle tone.  Psychiatric:        Mood and Affect: Affect normal.   Breast exam was performed in seated and lying down position.   Patient is status post lumpectomy of left breast  with a well-healed surgical scar with mild tissue thickening around the lumpectomy site.  No palpable masses bilateral breast  . No palpable bilateral axillary adenopathy.    RADIOGRAPHIC STUDIES: I have personally reviewed the radiological images as listed and agreed with the findings in the report. MM 3D SCREENING MAMMOGRAM BILATERAL BREAST  Result Date: 04/27/2023 CLINICAL DATA:  Screening. EXAM: DIGITAL SCREENING BILATERAL MAMMOGRAM WITH TOMOSYNTHESIS AND CAD TECHNIQUE: Bilateral screening digital craniocaudal and mediolateral oblique mammograms were obtained. Bilateral screening digital breast tomosynthesis was performed. The images were evaluated with computer-aided detection. COMPARISON:  Previous exam(s). ACR Breast Density Category b: There are scattered areas of fibroglandular density. FINDINGS: There are no findings suspicious for malignancy. IMPRESSION: No mammographic evidence of malignancy. A result letter of this screening mammogram will be mailed directly to the patient. RECOMMENDATION: Screening mammogram in one year. (Code:SM-B-01Y) BI-RADS CATEGORY  1: Negative. Electronically Signed   By: Baird Lyons M.D.   On: 04/27/2023 14:16

## 2023-07-21 NOTE — Assessment & Plan Note (Addendum)
#  Stage IA Breast cancer,-2018 Labs reviewed and discussed with patient.  Clinically she is doing very well She has finished 5 years of letrozole 2.5 mg daily, currently off Continue annual mammogram- Sept 2024 mammogram results discussed with patient.

## 2023-07-21 NOTE — Progress Notes (Signed)
Patient is doing well, she has been walking her dog more so she can try to lose some weight. She actually just started taking a weight loss medication. No new questions or concerns for the doctor today.

## 2023-07-21 NOTE — Assessment & Plan Note (Signed)
-

## 2023-07-21 NOTE — Assessment & Plan Note (Signed)
#   Osteopenia, continue calcium and vitamin D supplementation  Zometa treatments every 6 months.  DEXA every 2 years next due May 2025 

## 2023-09-03 ENCOUNTER — Encounter: Payer: Self-pay | Admitting: Oncology

## 2023-09-03 NOTE — Progress Notes (Signed)
[facility] [address] [place], [place] [address]  Pulmonary Sleep Medicine   Office Visit Note  Patient Name: Norma Austin DOB: 09-13-1960 MRN [id number]    Chief Complaint: Obstructive Sleep Apnea visit  Brief History:  Norma Austin is seen today for a 6 month follow up on APAP 9-18cmh20. The patient has an 11 month  history of sleep apnea. Patient is using PAP nightly.  The patient feels rested after sleeping with PAP.  The patient reports benefiting from PAP use. Reported sleepiness is  improved and the Epworth Sleepiness Score is 5 out of 24. The patient will occasionally take naps. The patient complains of the following: occasional headache.  The compliance download shows  97% compliance with an average use time of 7 hours. The AHI is 1.9  The patient does complain of limb movements disrupting sleep. The patient continues to require PAP therapy in order to eliminate sleep apnea.   ROS  General: (-) fever, (-) chills, (-) night sweat Nose and Sinuses: (-) nasal stuffiness or itchiness, (-) postnasal drip, (-) nosebleeds, (-) sinus trouble. Mouth and Throat: (-) sore throat, (-) hoarseness. Neck: (-) swollen glands, (-) enlarged thyroid, (-) neck pain. Respiratory: - cough, - shortness of breath, - wheezing. Neurologic: - numbness, - tingling. Psychiatric: + anxiety, - depression   Current Medication: Outpatient Encounter Medications as of 09/06/2023  Medication Sig   escitalopram (LEXAPRO) 10 MG tablet Take 10 mg by mouth daily.   acetaminophen (TYLENOL) 500 MG tablet Take 1,000 mg by mouth every 6 (six) hours as needed (for pain.).   celecoxib (CELEBREX) 100 MG capsule Take 100 mg by mouth 2 (two) times daily.   loratadine (CLARITIN) 10 MG tablet Take 10 mg by mouth daily.   Multiple Vitamins-Minerals (CENTRUM SILVER 50+WOMEN PO) Take by mouth.   rosuvastatin (CRESTOR) 10 MG tablet Take 10 mg by mouth daily.   XENICAL 120 MG capsule Take 120 mg by mouth 3  (three) times daily.   [DISCONTINUED] ergocalciferol (VITAMIN D2) 1.25 MG (50000 UT) capsule Take 1 capsule (50,000 Units total) by mouth once a week.   No facility-administered encounter medications on file as of 09/06/2023.    Surgical History: Past Surgical History:  Procedure Laterality Date   BREAST BIOPSY Left 02/24/2017   INVASIVE MAMMARY CARCINOMA   BREAST LUMPECTOMY Left 2018   Invasive, F/U with radiation and Letrozole    BREAST LUMPECTOMY WITH SENTINEL LYMPH NODE BIOPSY Left 03/18/2017   Procedure: BREAST LUMPECTOMY WITH SENTINEL LYMPH NODE BX;  Surgeon: Kieth Brightly, MD;  Location: [facility] ORS;  Service: General;  Laterality: Left;   BREAST MAMMOSITE Left 03/2017   GANGLION CYST EXCISION  1990   MOUTH SURGERY     4 BOTTOM FRONT DENTAL IMPLANTS    Medical History: Past Medical History:  Diagnosis Date   Arthritis    Breast cancer (HCC) 2018   INVASIVE MAMMARY CARCINOMA, T1c, N0, 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   GERD (gastroesophageal reflux disease)    Indigestion    Osteopenia 12/26/2018   Personal history of radiation therapy 2018   F/U left breast cancer   Plantar fasciitis     Family History: Non contributory to the present illness  Social History: Social History   Socioeconomic History   Marital status: Married    Spouse name: Not on file   Number of children: Not on file   Years of education: Not on file   Highest education level: Not on file  Occupational History   Not on file  Tobacco Use   Smoking status: Former    Current packs/day: 0.00    Average packs/day: 1 pack/day for 25.0 years (25.0 ttl pk-yrs)    Types: Cigarettes    Start date: 03/11/1982    Quit date: 03/12/2007    Years since quitting: 16.4   Smokeless tobacco: Never  Vaping Use   Vaping status: Never Used  Substance and Sexual Activity   Alcohol use: Yes    Alcohol/week: 0.0 standard drinks of alcohol    Comment: occasional   Drug use: No   Sexual activity: Not  on file  Other Topics Concern   Not on file  Social History Narrative   Not on file   Social Drivers of Health   Financial Resource Strain: Not on file  Food Insecurity: Not on file  Transportation Needs: Not on file  Physical Activity: Not on file  Stress: Not on file  Social Connections: Not on file  Intimate Partner Violence: Not on file    Vital Signs: Blood pressure 129/78, resp. rate 16, height 5\' 6"  (1.676 m), weight 225 lb (102.1 kg), last menstrual period 03/19/2011, SpO2 98%. Body mass index is 36.32 kg/m.    Examination: General Appearance: The patient is well-developed, well-nourished, and in no distress. Neck Circumference: 43 cm Skin: Gross inspection of skin unremarkable. Head: normocephalic, no gross deformities. Eyes: no gross deformities noted. ENT: ears appear grossly normal Neurologic: Alert and oriented. No involuntary movements.  STOP BANG RISK ASSESSMENT S (snore) Have you been told that you snore?     NO   T (tired) Are you often tired, fatigued, or sleepy during the day?   NO  O (obstruction) Do you stop breathing, choke, or gasp during sleep? NO   P (pressure) Do you have or are you being treated for high blood pressure? NO   B (BMI) Is your body index greater than 35 kg/m? YES   A (age) Are you 11 years old or older? YES   N (neck) Do you have a neck circumference greater than 16 inches?   YES   G (gender) Are you a female? NO   TOTAL STOP/BANG "YES" ANSWERS 3       A STOP-Bang score of 2 or less is considered low risk, and a score of 5 or more is high risk for having either moderate or severe OSA. For people who score 3 or 4, doctors may need to perform further assessment to determine how likely they are to have OSA.         EPWORTH SLEEPINESS SCALE:  Scale:  (0)= no chance of dozing; (1)= slight chance of dozing; (2)= moderate chance of dozing; (3)= high chance of dozing  Chance  Situtation    Sitting and reading: 1     Watching TV: 1    Sitting Inactive in public: 0    As a passenger in car: 1      Lying down to rest: 1    Sitting and talking: 0    Sitting quielty after lunch: 1    In a car, stopped in traffic: 0   TOTAL SCORE:   5 out of 24    SLEEP STUDIES:  PSG (10/07/2022)  AHI 56.8/Hr, REM 71.3/Hr, Min SP02 68%  Titration (10/29/2022) APAP 9-18cmh20                              CPAP COMPLIANCE DATA:  Date Range: 03/06/2023-09/01/2023  Average Daily Use: 7 hours  Median Use: 7 hrs 3 min  Compliance for > 4 Hours: 97% days  AHI: 1.9 respiratory events per hour  Days Used: 175/180  Mask Leak: 16.4  95th Percentile Pressure: 16.6cmh20         LABS: Recent Results (from the past 2160 hours)  CMP (Cancer Center only)     Status: Abnormal   Collection Time: 07/21/23 12:43 PM  Result Value Ref Range   Sodium 139 135 - 145 mmol/L   Potassium 4.3 3.5 - 5.1 mmol/L   Chloride 105 98 - 111 mmol/L   CO2 26 22 - 32 mmol/L   Glucose, Bld 99 70 - 99 mg/dL    Comment: Glucose reference range applies only to samples taken after fasting for at least 8 hours.   BUN 17 8 - 23 mg/dL   Creatinine 1.32 (H) 4.40 - 1.00 mg/dL   Calcium 9.1 8.9 - 10.2 mg/dL   Total Protein 7.3 6.5 - 8.1 g/dL   Albumin 4.0 3.5 - 5.0 g/dL   AST 16 15 - 41 U/L   ALT 18 0 - 44 U/L   Alkaline Phosphatase 39 38 - 126 U/L   Total Bilirubin 0.6 <1.2 mg/dL   GFR, Estimated 59 (L) >60 mL/min    Comment: (NOTE) Calculated using the CKD-EPI Creatinine Equation (2021)    Anion gap 8 5 - 15    Comment: Performed at [facility], [address] [place], [place] [address]  CBC with Differential (Cancer Center Only)     Status: None   Collection Time: 07/21/23 12:44 PM  Result Value Ref Range   WBC Count 7.4 4.0 - 10.5 K/uL   RBC 4.54 3.87 - 5.11 MIL/uL   Hemoglobin 13.7 12.0 - 15.0 g/dL   HCT  64.4 03.4 - 74.2 %   MCV 89.0 80.0 - 100.0 fL   MCH 30.2 26.0 - 34.0 pg   MCHC 33.9 30.0 - 36.0 g/dL   RDW 59.5 63.8 - 75.6 %   Platelet Count 171 150 - 400 K/uL   nRBC 0.0 0.0 - 0.2 %   Neutrophils Relative % 67 %   Neutro Abs 4.9 1.7 - 7.7 K/uL   Lymphocytes Relative 24 %   Lymphs Abs 1.8 0.7 - 4.0 K/uL   Monocytes Relative 6 %   Monocytes Absolute 0.4 0.1 - 1.0 K/uL   Eosinophils Relative 2 %   Eosinophils Absolute 0.2 0.0 - 0.5 K/uL   Basophils Relative 1 %   Basophils Absolute 0.1 0.0 - 0.1 K/uL   Immature Granulocytes 0 %   Abs Immature Granulocytes 0.03 0.00 - 0.07 K/uL    Comment: Performed at [facility], [address], [place], [place] [address]    Radiology: MM 3D SCREENING MAMMOGRAM BILATERAL BREAST Result Date: 04/27/2023 CLINICAL DATA:  Screening. EXAM: DIGITAL SCREENING BILATERAL MAMMOGRAM WITH TOMOSYNTHESIS AND CAD TECHNIQUE: Bilateral screening digital craniocaudal and mediolateral oblique mammograms were obtained. Bilateral screening digital breast tomosynthesis was performed. The images were evaluated with computer-aided detection. COMPARISON:  Previous exam(s). ACR Breast Density Category b: There are scattered areas of fibroglandular density. FINDINGS: There are no findings suspicious for malignancy. IMPRESSION: No mammographic evidence of malignancy. A result letter of  this screening mammogram will be mailed directly to the patient. RECOMMENDATION: Screening mammogram in one year. (Code:SM-B-01Y) BI-RADS CATEGORY  1: Negative. Electronically Signed   By: Baird Lyons M.D.   On: 04/27/2023 14:16    No results found.  No results found.    Assessment and Plan: Patient Active Problem List   Diagnosis Date Noted   CPAP use counseling 03/08/2023   OSA (obstructive sleep apnea) 09/28/2022   Morbid obesity (HCC) 09/28/2022   Aromatase inhibitor use 06/07/2020   Er+ PR+ carcinoma of breast, left (HCC) 06/07/2020   Vitamin D deficiency 04/11/2019   Use of  letrozole (Femara) 04/11/2019   Osteopenia 12/26/2018   Malignant neoplasm of upper-outer quadrant of left breast in female, estrogen receptor positive (HCC) 03/03/2017   1. OSA (obstructive sleep apnea) (Primary) The patient does tolerate PAP and reports  benefit from PAP use. The patient was reminded how to clean equipment and advised to replace supplies routinely. The patient was also counselled on weight loss. The compliance is excellent. The AHI is 1.9.   OSA on cpap- controlled. Continue with excellent compliance with pap. CPAP continues to be medically necessary to treat this patient's OSA. F/u one year.    2. CPAP use counseling CPAP Counseling: had a lengthy discussion with the patient regarding the importance of PAP therapy in management of the sleep apnea. Patient appears to understand the risk factor reduction and also understands the risks associated with untreated sleep apnea. Patient will try to make a good faith effort to remain compliant with therapy. Also instructed the patient on proper cleaning of the device including the water must be changed daily if possible and use of distilled water is preferred. Patient understands that the machine should be regularly cleaned with appropriate recommended cleaning solutions that do not damage the PAP machine for example given white vinegar and water rinses. Other methods such as ozone treatment may not be as good as these simple methods to achieve cleaning.      General Counseling: I have discussed the findings of the evaluation and examination with Jalah.  I have also discussed any further diagnostic evaluation thatmay be needed or ordered today. Carman verbalizes understanding of the findings of todays visit. We also reviewed her medications today and discussed drug interactions and side effects including but not limited excessive drowsiness and altered mental states. We also discussed that there is always a risk not just to her but also  people around her. she has been encouraged to call the office with any questions or concerns that should arise related to todays visit.  No orders of the defined types were placed in this encounter.       I have personally obtained a history, examined the patient, evaluated laboratory and imaging results, formulated the assessment and plan and placed orders. This patient was seen today by Emmaline Kluver, PA-C in collaboration with Dr. Freda Munro.   Yevonne Pax, MD [facility] Diplomate ABMS Pulmonary Critical Care Medicine and Sleep Medicine

## 2023-09-06 ENCOUNTER — Ambulatory Visit (INDEPENDENT_AMBULATORY_CARE_PROVIDER_SITE_OTHER): Payer: Medicare Other | Admitting: Internal Medicine

## 2023-09-06 VITALS — BP 129/78 | Resp 16 | Ht 66.0 in | Wt 225.0 lb

## 2023-09-06 DIAGNOSIS — Z7189 Other specified counseling: Secondary | ICD-10-CM | POA: Diagnosis not present

## 2023-09-06 DIAGNOSIS — G4733 Obstructive sleep apnea (adult) (pediatric): Secondary | ICD-10-CM | POA: Diagnosis not present

## 2023-09-06 NOTE — Patient Instructions (Signed)

## 2023-10-19 ENCOUNTER — Encounter: Payer: Self-pay | Admitting: Oncology

## 2023-12-30 ENCOUNTER — Encounter: Payer: Self-pay | Admitting: Oncology

## 2024-01-06 ENCOUNTER — Ambulatory Visit
Admission: RE | Admit: 2024-01-06 | Discharge: 2024-01-06 | Disposition: A | Discharge status: Home / Self Care | Payer: Medicare Other | Source: Ambulatory Visit | Attending: Oncology | Admitting: Oncology

## 2024-01-06 DIAGNOSIS — Z780 Asymptomatic menopausal state: Secondary | ICD-10-CM | POA: Insufficient documentation

## 2024-01-06 DIAGNOSIS — Z79811 Long term (current) use of aromatase inhibitors: Secondary | ICD-10-CM | POA: Insufficient documentation

## 2024-01-06 DIAGNOSIS — Z13820 Encounter for screening for osteoporosis: Secondary | ICD-10-CM | POA: Diagnosis present

## 2024-01-19 ENCOUNTER — Inpatient Hospital Stay: Payer: Medicare Other | Attending: Oncology

## 2024-01-19 ENCOUNTER — Encounter: Payer: Self-pay | Admitting: Oncology

## 2024-01-19 ENCOUNTER — Inpatient Hospital Stay (HOSPITAL_BASED_OUTPATIENT_CLINIC_OR_DEPARTMENT_OTHER): Payer: Medicare Other | Admitting: Oncology

## 2024-01-19 ENCOUNTER — Inpatient Hospital Stay: Payer: Medicare Other

## 2024-01-19 VITALS — BP 117/72 | HR 72 | Temp 97.6°F | Resp 20 | Ht 66.0 in | Wt 227.0 lb

## 2024-01-19 DIAGNOSIS — Z79899 Other long term (current) drug therapy: Secondary | ICD-10-CM | POA: Insufficient documentation

## 2024-01-19 DIAGNOSIS — Z803 Family history of malignant neoplasm of breast: Secondary | ICD-10-CM | POA: Insufficient documentation

## 2024-01-19 DIAGNOSIS — Z808 Family history of malignant neoplasm of other organs or systems: Secondary | ICD-10-CM | POA: Diagnosis not present

## 2024-01-19 DIAGNOSIS — E559 Vitamin D deficiency, unspecified: Secondary | ICD-10-CM

## 2024-01-19 DIAGNOSIS — Z853 Personal history of malignant neoplasm of breast: Secondary | ICD-10-CM | POA: Insufficient documentation

## 2024-01-19 DIAGNOSIS — M8580 Other specified disorders of bone density and structure, unspecified site: Secondary | ICD-10-CM

## 2024-01-19 DIAGNOSIS — Z801 Family history of malignant neoplasm of trachea, bronchus and lung: Secondary | ICD-10-CM | POA: Diagnosis not present

## 2024-01-19 DIAGNOSIS — Z170 Estrogen receptor positive status [ER+]: Secondary | ICD-10-CM | POA: Diagnosis not present

## 2024-01-19 DIAGNOSIS — Z87891 Personal history of nicotine dependence: Secondary | ICD-10-CM | POA: Insufficient documentation

## 2024-01-19 DIAGNOSIS — Z08 Encounter for follow-up examination after completed treatment for malignant neoplasm: Secondary | ICD-10-CM | POA: Diagnosis present

## 2024-01-19 DIAGNOSIS — C50412 Malignant neoplasm of upper-outer quadrant of left female breast: Secondary | ICD-10-CM | POA: Diagnosis not present

## 2024-01-19 LAB — CMP (CANCER CENTER ONLY)
ALT: 19 U/L (ref 0–44)
AST: 17 U/L (ref 15–41)
Albumin: 4.1 g/dL (ref 3.5–5.0)
Alkaline Phosphatase: 37 U/L — ABNORMAL LOW (ref 38–126)
Anion gap: 7 (ref 5–15)
BUN: 19 mg/dL (ref 8–23)
CO2: 25 mmol/L (ref 22–32)
Calcium: 9 mg/dL (ref 8.9–10.3)
Chloride: 103 mmol/L (ref 98–111)
Creatinine: 0.88 mg/dL (ref 0.44–1.00)
GFR, Estimated: >60 mL/min (ref >60)
Glucose, Bld: 103 mg/dL — ABNORMAL HIGH (ref 70–99)
Potassium: 3.9 mmol/L (ref 3.5–5.1)
Sodium: 135 mmol/L (ref 135–145)
Total Bilirubin: 1 mg/dL (ref 0.0–1.2)
Total Protein: 7.5 g/dL (ref 6.5–8.1)

## 2024-01-19 LAB — CBC WITH DIFFERENTIAL (CANCER CENTER ONLY)
Abs Immature Granulocytes: 0.02 10*3/uL (ref 0.00–0.07)
Basophils Absolute: 0.1 10*3/uL (ref 0.0–0.1)
Basophils Relative: 1 %
Eosinophils Absolute: 0.2 10*3/uL (ref 0.0–0.5)
Eosinophils Relative: 2 %
HCT: 42.1 % (ref 36.0–46.0)
Hemoglobin: 14.4 g/dL (ref 12.0–15.0)
Immature Granulocytes: 0 %
Lymphocytes Relative: 27 %
Lymphs Abs: 1.8 10*3/uL (ref 0.7–4.0)
MCH: 30.1 pg (ref 26.0–34.0)
MCHC: 34.2 g/dL (ref 30.0–36.0)
MCV: 87.9 fL (ref 80.0–100.0)
Monocytes Absolute: 0.4 10*3/uL (ref 0.1–1.0)
Monocytes Relative: 7 %
Neutro Abs: 4.2 10*3/uL (ref 1.7–7.7)
Neutrophils Relative %: 63 %
Platelet Count: 191 10*3/uL (ref 150–400)
RBC: 4.79 MIL/uL (ref 3.87–5.11)
RDW: 12.3 % (ref 11.5–15.5)
WBC Count: 6.6 10*3/uL (ref 4.0–10.5)
nRBC: 0 % (ref 0.0–0.2)

## 2024-01-19 NOTE — Progress Notes (Signed)
 Hematology/Oncology Progress note [phone] [phone]   ASSESSMENT & PLAN:   Malignant neoplasm of upper-outer quadrant of left breast in female, estrogen receptor positive (HCC) #Stage IA Breast cancer,-2018 Labs reviewed and discussed with patient.  Clinically she is doing very well She has finished 5 years of letrozole  2.5 mg daily Continue annual mammogram- next due Sept 2025 - she will ask PCP to order  Osteopenia # Osteopenia, continue calcium and vitamin D  supplementation  She has been on Zometa  treatments every 6 months.  DEXA May 2025 showed improved bone density, normal.  I will stop Zometa    Vitamin D  deficiency Continue Vitamin D  supplementation.   Patient is discharged from my clinic. I recommend patient to continue follow up with primary care physician. Patient may re-establish care in the future if clinically indicated.  All questions were answered. The patient knows to call the clinic with any problems, questions or concerns.  Timmy Forbes, MD, PhD [facility] Hematology Oncology 01/19/2024     REASON FOR VISIT Follow up for treatment of Stage IA  ER+ PR+ breast cancer  HISTORY OF PRESENTING ILLNESS: 1 Norma Austin 63 y.o. female presents for follow up of Stage IA  ER+ PR+ breast cancer .  Patient had routine mammogram screening which showed left breast mass. This was followed by a diagnostic Mammogram and ultrasound which showed an hypoechoic mass measuring 12 x 7 x 10 mm at 1:30, 7 cm from the nipple, corresponding to thespiculated mass seen mammographically. There is an adjacent near anechoic mass at 1 o'clock, 6 cm from the nipple measuring 6 x 4 x 4 mm. 1:30 mass was biopsy and showed invasive carcinoma of breast, unspecified type, no LVI, No DCIS. The 1 o'clock mass disappeared after biopsy of the 1:30 mass. It maybe a cyst which was poked by the needle for anesthesia.   2 Pathology: 02/24/2017 US  guided breast mass biopsy showed invasive  mammary carcinoma, grade 1, ER 90% PR90%, HER2 Equivocal by IHC, negative by FISH. S/p left breast lumpectomy and sentinel lymph node biopsy on 03/18/2017. Pathology showed 15mm invasive mammary carcinoma of no specific type, margins are all negative, and all 3 sentinel lymph nodes were negative. ER/PR 90%, HER2 FISH negative. Mammaprint showed low risk.  3  lumpectomy followed by MammoSite radiation. Mammaprint indicates low risk. Adjuvant chemotherapy is not offered.   Interval History:  Patient with above oncology history reviewed by me present for follow-up management of stage IA ER/PR positive HER 2 negative breast cancer. She is off letrozole , finished 5 years.   Overall she tolerates well No new complaints. No new breast concerns.    Review of Systems  Constitutional:  Negative for chills, fever, malaise/fatigue and weight loss.  HENT:  Negative for sore throat.   Eyes:  Negative for redness.  Respiratory:  Negative for cough, shortness of breath and wheezing.   Cardiovascular:  Negative for chest pain, palpitations and leg swelling.  Gastrointestinal:  Negative for abdominal pain, blood in stool, nausea and vomiting.  Genitourinary:  Negative for dysuria.  Musculoskeletal:  Positive for joint pain. Negative for myalgias.  Skin:  Negative for rash.  Neurological:  Negative for dizziness, tingling and tremors.  Endo/Heme/Allergies:  Does not bruise/bleed easily.  Psychiatric/Behavioral:  Negative for hallucinations.     MEDICAL HISTORY: Past Medical History:  Diagnosis Date   Arthritis    Breast cancer (HCC) 2018   INVASIVE MAMMARY CARCINOMA, T1c, N0, 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  GERD (gastroesophageal reflux disease)    Indigestion    Osteopenia 12/26/2018   Personal history of radiation therapy 2018   F/U left breast cancer   Plantar fasciitis     SURGICAL HISTORY: Past Surgical History:  Procedure Laterality Date   BREAST BIOPSY Left 02/24/2017   INVASIVE  MAMMARY CARCINOMA   BREAST LUMPECTOMY Left 2018   Invasive, F/U with radiation and Letrozole     BREAST LUMPECTOMY WITH SENTINEL LYMPH NODE BIOPSY Left 03/18/2017   Procedure: BREAST LUMPECTOMY WITH SENTINEL LYMPH NODE BX;  Surgeon: Jerlean Mood, MD;  Location: [facility] ORS;  Service: General;  Laterality: Left;   BREAST MAMMOSITE Left 03/2017   GANGLION CYST EXCISION  1990   MOUTH SURGERY     4 BOTTOM FRONT DENTAL IMPLANTS    SOCIAL HISTORY: Social History   Socioeconomic History   Marital status: Married    Spouse name: Not on file   Number of children: Not on file   Years of education: Not on file   Highest education level: Not on file  Occupational History   Not on file  Tobacco Use   Smoking status: Former    Current packs/day: 0.00    Average packs/day: 1 pack/day for 25.0 years (25.0 ttl pk-yrs)    Types: Cigarettes    Start date: 03/11/1982    Quit date: 03/12/2007    Years since quitting: 16.8   Smokeless tobacco: Never  Vaping Use   Vaping status: Never Used  Substance and Sexual Activity   Alcohol use: Yes    Alcohol/week: 0.0 standard drinks of alcohol    Comment: occasional   Drug use: No   Sexual activity: Not on file  Other Topics Concern   Not on file  Social History Narrative   Not on file   Social Drivers of Health   Financial Resource Strain: Not on file  Food Insecurity: Not on file  Transportation Needs: Not on file  Physical Activity: Not on file  Stress: Not on file  Social Connections: Not on file  Intimate Partner Violence: Not on file    FAMILY HISTORY Family History  Problem Relation Age of Onset   Breast cancer Mother 55   CVA Mother    Lung cancer Father    Thyroid cancer Cousin    Colon cancer Neg Hx     ALLERGIES:  is allergic to adhesive [tape].  MEDICATIONS:  Current Outpatient Medications  Medication Sig Dispense Refill   acetaminophen (TYLENOL) 500 MG tablet Take 1,000 mg by mouth every 6 (six) hours as needed  (for pain.).     buPROPion (WELLBUTRIN XL) 150 MG 24 hr tablet Take 150 mg by mouth every morning.     celecoxib (CELEBREX) 100 MG capsule Take 100 mg by mouth 2 (two) times daily.     doxepin (SINEQUAN) 10 MG capsule Take 10 mg by mouth at bedtime.     escitalopram (LEXAPRO) 10 MG tablet Take 10 mg by mouth daily.     loratadine (CLARITIN) 10 MG tablet Take 10 mg by mouth daily.     Multiple Vitamins-Minerals (CENTRUM SILVER 50+WOMEN PO) Take by mouth.     phentermine (ADIPEX-P) 37.5 MG tablet Take 37.5 mg by mouth daily.     rosuvastatin (CRESTOR) 10 MG tablet Take 10 mg by mouth daily.     No current facility-administered medications for this visit.    PHYSICAL EXAMINATION:  ECOG PERFORMANCE STATUS: 0 - Asymptomatic   Vitals:   01/19/24 1310  BP: 117/72  Pulse: 72  Resp: 20  Temp: 97.6 F (36.4 C)  SpO2: 96%    Filed Weights   01/19/24 1310  Weight: 227 lb (103 kg)     Physical Exam Constitutional:      General: She is not in acute distress.    Appearance: She is not diaphoretic.  HENT:     Head: Normocephalic and atraumatic.  Eyes:     General: No scleral icterus. Neck:     Vascular: No JVD.  Cardiovascular:     Rate and Rhythm: Normal rate and regular rhythm.  Pulmonary:     Effort: Pulmonary effort is normal. No respiratory distress.  Abdominal:     General: There is no distension.  Musculoskeletal:        General: Normal range of motion.     Cervical back: Neck supple.  Skin:    Findings: No erythema or rash.  Neurological:     Mental Status: She is alert and oriented to person, place, and time. Mental status is at baseline.     Motor: No abnormal muscle tone.  Psychiatric:        Mood and Affect: Mood and affect normal.   Breast exam was performed in seated and lying down position.   Patient is status post lumpectomy of left breast  with a well-healed surgical scar with mild tissue thickening around the lumpectomy site.  No palpable masses  bilateral breast  . No palpable bilateral axillary adenopathy.    RADIOGRAPHIC STUDIES: I have personally reviewed the radiological images as listed and agreed with the findings in the report. DG Bone Density Result Date: 01/10/2024 EXAM: DUAL X-RAY ABSORPTIOMETRY (DXA) FOR BONE MINERAL DENSITY 01/06/2024 10:44 am CLINICAL DATA:  63 year old Female Postmenopausal. Screening for osteoporosis Patient is or has been on bone building therapies. TECHNIQUE: An axial (e.g., hips, spine) and/or appendicular (e.g., radius) exam was performed, as appropriate, using GE Secretary/administrator at [facility] Images are obtained for bone mineral density measurement and are not obtained for diagnostic purposes. [id number] Exclusions: None. COMPARISON:  12/30/2021 FINDINGS: Scan quality: Good. LUMBAR SPINE (L1-L4): BMD (in g/cm2): 1.123 T-score: -0.6 Z-score: 1.0 LEFT FEMORAL NECK: BMD (in g/cm2): 0.968 T-score: -0.5 Z-score: 0.9 LEFT TOTAL HIP: BMD (in g/cm2): 0.928 T-score: -0.6 Z-score: 0.5 RIGHT FEMORAL NECK: BMD (in g/cm2): 0.942 T-score: -0.7 Z-score: 0.8 Rate of change from previous exam: No significant rate of change from previous exam. RIGHT TOTAL HIP: BMD (in g/cm2): 0.935 T-score: -0.6 Z-score: 0.6 DUAL-FEMUR TOTAL MEAN: Rate of change from previous exam: No significant rate of change from previous exam. FRAX 10-YEAR PROBABILITY OF FRACTURE: FRAX not reported as the patient is receiving bone building therapy. IMPRESSION: Normal based on BMD. Fracture risk is unknown due to history of bone building therapy. RECOMMENDATIONS: 1. All patients should optimize calcium and vitamin D  intake. 2. Consider FDA-approved medical therapies in postmenopausal women and men aged 71 years and older, based on the following: - A hip or vertebral (clinical or morphometric) fracture - T-score less than or equal to -2.5 and secondary causes have been excluded. - Low bone mass (T-score between -1.0 and -2.5) and a  10-year probability of a hip fracture greater than or equal to 3% or a 10-year probability of a major osteoporosis-related fracture greater than or equal to 20% based on the [place] -adapted WHO algorithm. - Clinician judgment and/or patient preferences may indicate treatment for people with 10-year fracture probabilities above or below  these levels 3. Patients with diagnosis of osteoporosis or at high risk for fracture should have regular bone mineral density tests. For patients eligible for Medicare, routine testing is allowed once every 2 years. The testing frequency can be increased to one year for patients who have rapidly progressing disease, those who are receiving or discontinuing medical therapy to restore bone mass, or have additional risk factors. Electronically Signed   By: Amanda Jungling M.D.   On: 01/10/2024 10:22

## 2024-01-19 NOTE — Assessment & Plan Note (Signed)
 Continue Vitamin D supplementation

## 2024-01-19 NOTE — Progress Notes (Signed)
 Patient is doing fine, she had a Bone density scan on 01/06/2024. No new questions for the doctor today.

## 2024-01-19 NOTE — Assessment & Plan Note (Addendum)
#   Osteopenia, continue calcium and vitamin D  supplementation  She has been on Zometa  treatments every 6 months.  DEXA May 2025 showed improved bone density, normal.  I will stop Zometa

## 2024-01-19 NOTE — Assessment & Plan Note (Addendum)
#  Stage IA Breast cancer,-2018 Labs reviewed and discussed with patient.  Clinically she is doing very well She has finished 5 years of letrozole  2.5 mg daily Continue annual mammogram- next due Sept 2025 - she will ask PCP to order

## 2024-04-28 ENCOUNTER — Other Ambulatory Visit: Payer: Self-pay | Admitting: Primary Care

## 2024-04-28 DIAGNOSIS — Z1231 Encounter for screening mammogram for malignant neoplasm of breast: Secondary | ICD-10-CM

## 2024-05-09 ENCOUNTER — Ambulatory Visit
Admission: EM | Admit: 2024-05-09 | Discharge: 2024-05-09 | Disposition: A | Discharge status: Home / Self Care | Attending: Emergency Medicine | Admitting: Emergency Medicine

## 2024-05-09 DIAGNOSIS — R42 Dizziness and giddiness: Secondary | ICD-10-CM | POA: Diagnosis not present

## 2024-05-09 DIAGNOSIS — H9313 Tinnitus, bilateral: Secondary | ICD-10-CM

## 2024-05-09 DIAGNOSIS — R110 Nausea: Secondary | ICD-10-CM

## 2024-05-09 MED ORDER — MECLIZINE HCL 12.5 MG PO TABS: 12.5000 mg | ORAL_TABLET | Freq: Three times a day (TID) | ORAL | 0 refills | Status: AC | PRN

## 2024-05-09 NOTE — ED Provider Notes (Signed)
 [facility]    CSN: [id number] Arrival date & time: 05/09/24  1447      History   Chief Complaint Chief Complaint  Patient presents with   Emesis   Dizziness   Nausea   Tinnitus    HPI Norma Austin is a 63 y.o. female.  Patient presents with an episode this afternoon of increased ringing in her ears, along with dizziness, cold sweat, and nausea.  She had some dry heaving but did not actually vomit.  Her symptoms were worse when she moved her head.  The ringing in her ears has now returned to her normal level; she has tinnitus.  The dizziness has subsided, although she does feel slightly off balance.  She does still have some nausea but has not vomited.  At the time of the episode, she called EMS and was evaluated but not transported.  Patient reports history of vertigo several years ago but none recently.  She denies chest pain, shortness of breath, focal weakness, numbness, vision change.  The history is provided by the spouse, the patient and medical records.    Past Medical History:  Diagnosis Date   Arthritis    Breast cancer (HCC) 2018   INVASIVE MAMMARY CARCINOMA, T1c, N0, 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   GERD (gastroesophageal reflux disease)    Indigestion    Osteopenia 12/26/2018   Personal history of radiation therapy 2018   F/U left breast cancer   Plantar fasciitis     Patient Active Problem List   Diagnosis Date Noted   CPAP use counseling 03/08/2023   OSA (obstructive sleep apnea) 09/28/2022   Morbid obesity (HCC) 09/28/2022   Aromatase inhibitor use 06/07/2020   Er+ PR+ carcinoma of breast, left (HCC) 06/07/2020   Vitamin D  deficiency 04/11/2019   Use of letrozole  (Femara ) 04/11/2019   Osteopenia 12/26/2018   Malignant neoplasm of upper-outer quadrant of left breast in female, estrogen receptor positive (HCC) 03/03/2017    Past Surgical History:  Procedure Laterality Date   BREAST BIOPSY Left 02/24/2017   INVASIVE MAMMARY  CARCINOMA   BREAST LUMPECTOMY Left 2018   Invasive, F/U with radiation and Letrozole     BREAST LUMPECTOMY WITH SENTINEL LYMPH NODE BIOPSY Left 03/18/2017   Procedure: BREAST LUMPECTOMY WITH SENTINEL LYMPH NODE BX;  Surgeon: Dellie Louanne MATSU, MD;  Location: [facility] ORS;  Service: General;  Laterality: Left;   BREAST MAMMOSITE Left 03/2017   GANGLION CYST EXCISION  1990   MOUTH SURGERY     4 BOTTOM FRONT DENTAL IMPLANTS    OB History     Gravida  0   Para  0   Term  0   Preterm  0   AB  0   Living  0      SAB  0   IAB  0   Ectopic  0   Multiple  0   Live Births  0        Obstetric Comments  1st Menstrual Cycle:  10             Home Medications    Prior to Admission medications   Medication Sig Start Date End Date Taking? Authorizing Provider  meclizine (ANTIVERT) 12.5 MG tablet Take 1 tablet (12.5 mg total) by mouth 3 (three) times daily as needed for dizziness. 05/09/24  Yes Corlis Burnard DEL, NP  acetaminophen (TYLENOL) 500 MG tablet Take 1,000 mg by mouth every 6 (six) hours as needed (for pain.).  [provider]  buPROPion (WELLBUTRIN XL) 150 MG 24 hr tablet Take 150 mg by mouth every morning. 12/22/23   [provider]  celecoxib (CELEBREX) 100 MG capsule Take 100 mg by mouth 2 (two) times daily. 11/07/21   [provider]  doxepin (SINEQUAN) 10 MG capsule Take 10 mg by mouth at bedtime. 12/22/23   [provider]  escitalopram (LEXAPRO) 10 MG tablet Take 10 mg by mouth daily. 08/25/23   [provider]  loratadine (CLARITIN) 10 MG tablet Take 10 mg by mouth daily.    [provider]  Multiple Vitamins-Minerals (CENTRUM SILVER 50+WOMEN PO) Take by mouth.    [provider]  phentermine (ADIPEX-P) 37.5 MG tablet Take 37.5 mg by mouth daily. 12/29/23   [provider]  rosuvastatin (CRESTOR) 10 MG tablet Take 10 mg by mouth daily. 12/09/18   [provider]    Family  History Family History  Problem Relation Age of Onset   Breast cancer Mother 37   CVA Mother    Lung cancer Father    Thyroid cancer Cousin    Colon cancer Neg Hx     Social History Social History   Tobacco Use   Smoking status: Former    Current packs/day: 0.00    Average packs/day: 1 pack/day for 25.0 years (25.0 ttl pk-yrs)    Types: Cigarettes    Start date: 03/11/1982    Quit date: 03/12/2007    Years since quitting: 17.1   Smokeless tobacco: Never  Vaping Use   Vaping status: Never Used  Substance Use Topics   Alcohol use: Yes    Alcohol/week: 0.0 standard drinks of alcohol    Comment: occasional   Drug use: No     Allergies   Adhesive [tape]   Review of Systems Review of Systems  Constitutional:  Negative for chills and fever.  Respiratory:  Negative for cough and shortness of breath.   Cardiovascular:  Negative for chest pain and palpitations.  Gastrointestinal:  Positive for nausea. Negative for vomiting.  Neurological:  Positive for dizziness and light-headedness. Negative for syncope, facial asymmetry, speech difficulty, weakness and numbness.     Physical Exam Triage Vital Signs ED Triage Vitals  Encounter Vitals Group     BP 05/09/24 1540 124/85     Girls Systolic BP Percentile --      Girls Diastolic BP Percentile --      Boys Systolic BP Percentile --      Boys Diastolic BP Percentile --      Pulse Rate 05/09/24 1540 83     Resp 05/09/24 1540 18     Temp 05/09/24 1540 97.6 F (36.4 C)     Temp src --      SpO2 05/09/24 1540 98 %     Weight --      Height --      Head Circumference --      Peak Flow --      Pain Score 05/09/24 1529 0     Pain Loc --      Pain Education --      Exclude from Growth Chart --    No data found.  Updated Vital Signs BP 124/85   Pulse 83   Temp 97.6 F (36.4 C)   Resp 18   LMP 03/19/2011 (Approximate) Comment: LMP 2011 or 2012  SpO2 98%   Visual Acuity Right Eye Distance:   Left Eye Distance:    Bilateral Distance:  Right Eye Near:   Left Eye Near:    Bilateral Near:     Physical Exam Constitutional:      General: She is not in acute distress.    Appearance: She is obese.  HENT:     Right Ear: Tympanic membrane normal.     Left Ear: Tympanic membrane normal.     Nose: Nose normal.     Mouth/Throat:     Mouth: Mucous membranes are moist.     Pharynx: Oropharynx is clear.  Cardiovascular:     Rate and Rhythm: Normal rate and regular rhythm.     Heart sounds: Normal heart sounds.  Pulmonary:     Effort: Pulmonary effort is normal. No respiratory distress.     Breath sounds: Normal breath sounds.  Abdominal:     General: Bowel sounds are normal.     Palpations: Abdomen is soft.     Tenderness: There is no abdominal tenderness. There is no guarding.     Hernia: No hernia is present.  Skin:    General: Skin is warm and dry.  Neurological:     General: No focal deficit present.     Mental Status: She is alert and oriented to person, place, and time.     Sensory: No sensory deficit.     Motor: No weakness.     Gait: Gait normal.      UC Treatments / Results  Labs (all labs ordered are listed, but only abnormal results are displayed) Labs Reviewed - No data to display  EKG   Radiology No results found.  Procedures Procedures (including critical care time)  Medications Ordered in UC Medications - No data to display  Initial Impression / Assessment and Plan / UC Course  I have reviewed the triage vital signs and the nursing notes.  Pertinent labs & imaging results that were available during my care of the patient were reviewed by me and considered in my medical decision making (see chart for details).    Dizziness, nausea without vomiting, tinnitus.  Afebrile and vital signs are stable.  Discussed limitations of evaluation of her symptoms in an urgent care setting.  Patient declines transfer to the ED.  She declines EKG.  Treating her dizziness today  with meclizine.  Precautions for drowsiness with meclizine discussed.  Instructed her to follow-up with her PCP tomorrow.  ED precautions discussed.  Education provided on dizziness, nausea, tinnitus.  Patient agrees to plan of care.  Final Clinical Impressions(s) / UC Diagnoses   Final diagnoses:  Dizziness  Nausea without vomiting  Tinnitus of both ears     Discharge Instructions      Follow up with your primary care provider tomorrow.  Go to the emergency department if you have worsening symptoms.   Take the meclizine as directed for dizziness.      ED Prescriptions     Medication Sig Dispense Auth. Provider   meclizine (ANTIVERT) 12.5 MG tablet Take 1 tablet (12.5 mg total) by mouth 3 (three) times daily as needed for dizziness. 10 tablet Corlis Burnard DEL, NP      PDMP not reviewed this encounter.   Corlis Burnard DEL, NP 05/09/24 1620

## 2024-05-09 NOTE — ED Triage Notes (Signed)
 Patient to Urgent Care with complaints of emesis/ nausea/ dizziness/ ringing in her ears.  Symptoms started approx 1hr ago. Reports she started getting a loud ringing in her ears, followed by dizziness and dry heaving. Broke out in a cold sweat. Reports when she moves her head symptoms are much worse.   Patient was evaluated by EMS PTA. VSS with EMS.

## 2024-05-09 NOTE — Discharge Instructions (Addendum)
 Follow up with your primary care provider tomorrow.  Go to the emergency department if you have worsening symptoms.   Take the meclizine as directed for dizziness.

## 2024-05-16 ENCOUNTER — Encounter: Payer: Self-pay | Admitting: Dermatology

## 2024-05-16 ENCOUNTER — Ambulatory Visit: Payer: Medicare Other | Admitting: Dermatology

## 2024-05-16 ENCOUNTER — Encounter: Payer: Self-pay | Admitting: Oncology

## 2024-05-16 DIAGNOSIS — L578 Other skin changes due to chronic exposure to nonionizing radiation: Secondary | ICD-10-CM

## 2024-05-16 DIAGNOSIS — L918 Other hypertrophic disorders of the skin: Secondary | ICD-10-CM

## 2024-05-16 DIAGNOSIS — L821 Other seborrheic keratosis: Secondary | ICD-10-CM

## 2024-05-16 DIAGNOSIS — Z1283 Encounter for screening for malignant neoplasm of skin: Secondary | ICD-10-CM

## 2024-05-16 DIAGNOSIS — L729 Follicular cyst of the skin and subcutaneous tissue, unspecified: Secondary | ICD-10-CM

## 2024-05-16 DIAGNOSIS — Q825 Congenital non-neoplastic nevus: Secondary | ICD-10-CM | POA: Diagnosis not present

## 2024-05-16 DIAGNOSIS — L720 Epidermal cyst: Secondary | ICD-10-CM

## 2024-05-16 DIAGNOSIS — L814 Other melanin hyperpigmentation: Secondary | ICD-10-CM

## 2024-05-16 DIAGNOSIS — D1801 Hemangioma of skin and subcutaneous tissue: Secondary | ICD-10-CM

## 2024-05-16 DIAGNOSIS — W908XXA Exposure to other nonionizing radiation, initial encounter: Secondary | ICD-10-CM | POA: Diagnosis not present

## 2024-05-16 DIAGNOSIS — D229 Melanocytic nevi, unspecified: Secondary | ICD-10-CM

## 2024-05-16 NOTE — Progress Notes (Signed)
 Follow-Up Visit   Subjective  Norma Austin is a 63 y.o. female who presents for the following: Skin Cancer Screening and Full Body Skin Exam  The patient presents for Total-Body Skin Exam (TBSE) for skin cancer screening and mole check. The patient has spots, moles and lesions to be evaluated, some may be new or changing. She has a growth in her scalp present since she was a teenager. She would like removed. She feels like it has grown.  She also has a cyst that drains and gets sore on her chest.   The following portions of the chart were reviewed this encounter and updated as appropriate: medications, allergies, medical history  Review of Systems:  No other skin or systemic complaints except as noted in HPI or Assessment and Plan.  Objective  Well appearing patient in no apparent distress; mood and affect are within normal limits.  A full examination was performed including scalp, head, eyes, ears, nose, lips, neck, chest, axillae, abdomen, back, buttocks, bilateral upper extremities, bilateral lower extremities, hands, feet, fingers, toes, fingernails, and toenails. All findings within normal limits unless otherwise noted below.   Relevant physical exam findings are noted in the Assessment and Plan.    Assessment & Plan   SKIN CANCER SCREENING PERFORMED TODAY.  ACTINIC DAMAGE - Chronic condition, secondary to cumulative UV/sun exposure - diffuse scaly erythematous macules with underlying dyspigmentation - Recommend daily broad spectrum sunscreen SPF 30+ to sun-exposed areas, reapply every 2 hours as needed.  - Staying in the shade or wearing long sleeves, sun glasses (UVA+UVB protection) and wide brim hats (4-inch brim around the entire circumference of the hat) are also recommended for sun protection.  - Call for new or changing lesions.  LENTIGINES, SEBORRHEIC KERATOSES (including R infraocular), HEMANGIOMAS - Benign normal skin lesions - Benign-appearing - Call for any  changes  MELANOCYTIC NEVI - Tan-brown and/or pink-flesh-colored symmetric macules and papules  - Benign appearing on exam today - Observation - Call clinic for new or changing moles - Recommend daily use of broad spectrum spf 30+ sunscreen to sun-exposed areas.   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 Will send referral to Dr. Paci for excision.   EPIDERMAL INCLUSION CYST Exam: Subcutaneous nodule at left sternum 6.0 mm; similar at right posterior shoulder  Benign-appearing. Exam most consistent with an epidermal inclusion cyst. Discussed that a cyst is a benign growth that can grow over time and sometimes get irritated or inflamed. Recommend observation if it is not bothersome. Discussed option of surgical excision to remove it if it is growing, symptomatic, or other changes noted. Please call for new or changing lesions so they can be evaluated.   Cyst with symptoms and/or recent change.  Discussed surgical excision to remove, including resulting scar and possible recurrence.  Patient will schedule for surgery. Pre-op information given. Patient scheduled 05/29/24 at 3:30 PM for cyst excision left sternum.  Acrochordons (Skin Tags) - Fleshy, skin-colored pedunculated papules - Benign appearing.  - Observe. - If desired, they can be removed with an in office procedure that is not covered by insurance. - Please call the clinic if you notice any new or changing lesions.   CONGENITAL NON-NEOPLASTIC NEVUS   Related Procedures Ambulatory referral to Dermatology Return as scheduled, for cyst excision left sternum.  LILLETTE Andrea Kerns, CMA, am acting as scribe for Rexene Rattler, MD .   Documentation: I have reviewed the above documentation  for accuracy and completeness, and I agree with the above.  Rexene Rattler, MD

## 2024-05-16 NOTE — Patient Instructions (Addendum)
 Referral will be sent to Dr. Rufus Holy at [facility].  [address], [place], [place] [address] Phone: [phone]    Pre-Operative Instructions You are scheduled for a surgical procedure at [facility]. We recommend you read the following instructions. If you have any questions or concerns, please call the office at [phone].  Shower and wash the entire body with soap and water the day of your surgery paying special attention to cleansing at and around the planned surgery site.  Please continue to take your anticoagulants (blood thinners) as you normally     would before and after surgery if they were prescribed by a medical provider. Stopping them could be harmful to you. We have multiple tools in dermatology to stop the bleeding even if you take an anticoagulant. If you take over the counter blood thinner such as aspirin, Ibuprofen (Motrin, Advil and Nuprin), Naprosyn, Voltaren, Relafen, etc. that was not prescribed or recommended by a medical provider, we recommend that you stop taking it for a week before your surgery and wait to restart until 2 days after your surgery.  Please inform us  of all medications you are currently taking. All medications that are taken regularly should be taken the day of surgery as you always do. Nevertheless, we need to be informed of what medications you are taking prior to surgery to know whether they will affect the procedure or cause any complications.   Please inform us  of any medication allergies. Also inform us  of whether you have allergies to Latex or rubber products or whether you have had any adverse reaction to Lidocaine or Epinephrine.  Please inform us  of any prosthetic or artificial body parts such as artificial heart valve, joint replacements, etc., or similar condition that might require preoperative antibiotics.   We recommend avoidance of alcohol at least two weeks prior to surgery and continued avoidance  for at least two weeks after surgery.   We recommend discontinuation of tobacco smoking at least two weeks prior to surgery and continued abstinence for at least two weeks after surgery.  Do not plan strenuous exercise, strenuous work or strenuous lifting for approximately four weeks after your surgery.   We request if you are unable to make your scheduled surgical appointment, please call us  at least a week in advance or as soon as you are aware of a problem so that we can cancel or reschedule the appointment.   You MAY TAKE TYLENOL (acetaminophen) for pain as it is not a blood thinner.   PLEASE PLAN TO BE IN TOWN FOR TWO WEEKS FOLLOWING SURGERY, THIS IS IMPORTANT SO YOU CAN BE CHECKED FOR DRESSING CHANGES, FUTURE REMOVAL AND TO MONITOR FOR POSSIBLE COMPLICATIONS.    Seborrheic Keratosis  What causes seborrheic keratoses? Seborrheic keratoses are harmless, common skin growths that first appear during adult life.  As time goes by, more growths appear.  Some people may develop a large number of them.  Seborrheic keratoses appear on both covered and uncovered body parts.  They are not caused by sunlight.  The tendency to develop seborrheic keratoses can be inherited.  They vary in color from skin-colored to gray, brown, or even black.  They can be either smooth or have a rough, warty surface.   Seborrheic keratoses are superficial and look as if they were stuck on the skin.  Under the microscope this type of keratosis looks like layers upon layers of skin.  That is why at times the top  layer may seem to fall off, but the rest of the growth remains and re-grows.    Treatment Seborrheic keratoses do not need to be treated, but can easily be removed in the office.  Seborrheic keratoses often cause symptoms when they rub on clothing or jewelry.  Lesions can be in the way of shaving.  If they become inflamed, they can cause itching, soreness, or burning.  Removal of a seborrheic keratosis can be  accomplished by freezing, burning, or surgery. If any spot bleeds, scabs, or grows rapidly, please return to have it checked, as these can be an indication of a skin cancer.  Recommend starting moisturizer with exfoliant (Urea, Salicylic acid, or Lactic acid) one to two times daily to help smooth rough and bumpy skin.  OTC options include Cetaphil Rough and Bumpy lotion (Urea), Eucerin Roughness Relief lotion or spot treatment cream (Urea), CeraVe SA lotion/cream for Rough and Bumpy skin (Sal Acid), Gold Bond Rough and Bumpy cream (Sal Acid), and AmLactin 12% lotion/cream (Lactic Acid).  If applying in morning, also apply sunscreen to sun-exposed areas, since these exfoliating moisturizers can increase sensitivity to sun.   Due to recent changes in healthcare laws, you may see results of your pathology and/or laboratory studies on MyChart before the doctors have had a chance to review them. We understand that in some cases there may be results that are confusing or concerning to you. Please understand that not all results are received at the same time and often the doctors may need to interpret multiple results in order to provide you with the best plan of care or course of treatment. Therefore, we ask that you please give us  2 business days to thoroughly review all your results before contacting the office for clarification. Should we see a critical lab result, you will be contacted sooner.   If You Need Anything After Your Visit  If you have any questions or concerns for your doctor, please call our main line at [phone] and press option 4 to reach your doctor's medical assistant. If no one answers, please leave a voicemail as directed and we will return your call as soon as possible. Messages left after 4 pm will be answered the following business day.   You may also send us  a message via MyChart. We typically respond to MyChart messages within 1-2 business days.  For prescription refills,  please ask your pharmacy to contact our office. Our fax number is [phone].  If you have an urgent issue when the clinic is closed that cannot wait until the next business day, you can page your doctor at the number below.    Please note that while we do our best to be available for urgent issues outside of office hours, we are not available 24/7.   If you have an urgent issue and are unable to reach us , you may choose to seek medical care at your doctor's office, retail clinic, urgent care center, or emergency room.  If you have a medical emergency, please immediately call 911 or go to the emergency department.  Pager Numbers  - Dr. Hester: [phone]  - Dr. Jackquline: [phone]  - Dr. Claudene: [phone]   - Dr. Raymund: [phone]  In the event of inclement weather, please call our main line at [phone] for an update on the status of any delays or closures.  Dermatology Medication Tips: Please keep the boxes that topical medications come in in order to help keep track of the instructions about  where and how to use these. Pharmacies typically print the medication instructions only on the boxes and not directly on the medication tubes.   If your medication is too expensive, please contact our office at [phone] option 4 or send us  a message through MyChart.   We are unable to tell what your co-pay for medications will be in advance as this is different depending on your insurance coverage. However, we may be able to find a substitute medication at lower cost or fill out paperwork to get insurance to cover a needed medication.   If a prior authorization is required to get your medication covered by your insurance company, please allow us  1-2 business days to complete this process.  Drug prices often vary depending on where the prescription is filled and some pharmacies may offer cheaper prices.  The website www.goodrx.com contains coupons for medications through  different pharmacies. The prices here do not account for what the cost may be with help from insurance (it may be cheaper with your insurance), but the website can give you the price if you did not use any insurance.  - You can print the associated coupon and take it with your prescription to the pharmacy.  - You may also stop by our office during regular business hours and pick up a GoodRx coupon card.  - If you need your prescription sent electronically to a different pharmacy, notify our office through [facility] or by phone at [phone] option 4.     Si Usted Necesita Algo Despus de Su Visita  Tambin puede enviarnos un mensaje a travs de Clinical cytogeneticist. Por lo general respondemos a los mensajes de MyChart en el transcurso de 1 a 2 das hbiles.  Para renovar recetas, por favor pida a su farmacia que se ponga en contacto con nuestra oficina. Randi lakes de fax es [place] [phone].  Si tiene un asunto urgente cuando la clnica est cerrada y que no puede esperar hasta el siguiente da hbil, puede llamar/localizar a su doctor(a) al nmero que aparece a continuacin.   Por favor, tenga en cuenta que aunque hacemos todo lo posible para estar disponibles para asuntos urgentes fuera del horario de [place], no estamos disponibles las 24 horas del da, los 7 [address]  [address] de la [place].   Si tiene un problema urgente y no puede comunicarse con nosotros, puede optar por buscar atencin mdica  en el consultorio de su doctor(a), en una clnica privada, en un centro de atencin urgente o en una sala de emergencias.  Si tiene Engineer, drilling, por favor llame inmediatamente al 911 o vaya a la sala de emergencias.  Nmeros de bper  - Dr. Hester: [phone]  - Dra. Jackquline: [phone]  - Dr. Claudene: [phone]  - Dra. Kitts: [phone]  En caso de inclemencias del [place], por favor llame a nuestra lnea principal al [phone] para una actualizacin sobre el estado de cualquier  retraso o cierre.  Consejos para la medicacin en dermatologa: Por favor, guarde las cajas en las que vienen los medicamentos de uso tpico para ayudarle a seguir las instrucciones sobre dnde y cmo usarlos. Las farmacias generalmente imprimen las instrucciones del medicamento slo en las cajas y no directamente en los tubos del [place].   Si su medicamento es muy caro, por favor, pngase en contacto con landry rieger llamando al [phone] y presione la opcin 4 o envenos un mensaje a travs de Clinical cytogeneticist.   No podemos decirle cul ser su copago por los medicamentos  por adelantado ya que esto es diferente dependiendo de la cobertura de su seguro. Sin embargo, es posible que podamos encontrar un medicamento sustituto a Audiological scientist un formulario para que el seguro cubra el medicamento que se considera necesario.   Si se requiere una autorizacin previa para que su compaa de seguros malta su medicamento, por favor permtanos de 1 a 2 das hbiles para completar este proceso.  Los precios de los medicamentos varan con frecuencia dependiendo del Environmental consultant de dnde se surte la receta y alguna farmacias pueden ofrecer precios ms baratos.  El sitio web www.goodrx.com tiene cupones para medicamentos de Health and safety inspector. Los precios aqu no tienen en cuenta lo que podra costar con la ayuda del seguro (puede ser ms barato con su seguro), pero el sitio web puede darle el precio si no utiliz Tourist information centre manager.  - Puede imprimir el cupn correspondiente y llevarlo con su receta a la farmacia.  - Tambin puede pasar por nuestra oficina durante el horario de atencin regular y Education officer, museum una tarjeta de cupones de GoodRx.  - Si necesita que su receta se enve electrnicamente a una farmacia diferente, informe a nuestra oficina a travs de MyChart de [place] o por telfono llamando al [phone] y presione la opcin 4.

## 2024-05-29 ENCOUNTER — Ambulatory Visit (INDEPENDENT_AMBULATORY_CARE_PROVIDER_SITE_OTHER): Admitting: Dermatology

## 2024-05-29 DIAGNOSIS — L720 Epidermal cyst: Secondary | ICD-10-CM

## 2024-05-29 DIAGNOSIS — D485 Neoplasm of uncertain behavior of skin: Secondary | ICD-10-CM

## 2024-05-29 NOTE — Progress Notes (Signed)
   Follow-Up Visit   Subjective  Norma Austin is a 63 y.o. female who presents for the following: Cyst of the left sternum, excise today.  The following portions of the chart were reviewed this encounter and updated as appropriate: medications, allergies, medical history  Review of Systems:  No other skin or systemic complaints except as noted in HPI or Assessment and Plan.  Objective  Well appearing patient in no apparent distress; mood and affect are within normal limits.  A focused examination was performed of the following areas: Face, chest  Relevant physical exam findings are noted in the Assessment and Plan.  mid sternum Subcutaneous nodule at left sternum 6 mm   Assessment & Plan   NEOPLASM OF UNCERTAIN BEHAVIOR OF SKIN mid sternum Skin excision  Lesion length (cm):  0.6 Lesion width (cm):  0.6 Margin per side (cm):  0.1 Total excision diameter (cm):  0.8 Informed consent: discussed and consent obtained   Timeout: patient name, date of birth, surgical site, and procedure verified   Procedure prep:  Patient was prepped and draped in usual sterile fashion Prep type:  Povidone-iodine Anesthesia: the lesion was anesthetized in a standard fashion   Anesthetic:  1% lidocaine w/ epinephrine 1-100,000 buffered w/ 8.4% NaHCO3 (Total lido w/epi - 12 cc) Instrument used comment:  #15c blade Hemostasis achieved with: pressure and electrodesiccation   Outcome: patient tolerated procedure well with no complications    Skin repair Complexity:  Intermediate Final length (cm):  1.5 Informed consent: discussed and consent obtained   Reason for type of repair: reduce tension to allow closure, reduce the risk of dehiscence, infection, and necrosis, reduce subcutaneous dead space and avoid a hematoma, preserve normal anatomical and functional relationships and enhance both functionality and cosmetic results   Undermining: edges undermined   Subcutaneous layers (deep stitches):   Suture size:  4-0 Suture type: Vicryl (polyglactin 910)   Stitches:  Buried vertical mattress Fine/surface layer approximation (top stitches):  Suture size:  4-0 Suture type: nylon   Stitches: simple interrupted   Suture removal (days):  7 Hemostasis achieved with: suture Outcome: patient tolerated procedure well with no complications   Post-procedure details: sterile dressing applied and wound care instructions given   Dressing type: pressure dressing (mupirocin)    Specimen 1 - Surgical pathology Differential Diagnosis: Cyst vs other Check Margins: No   Return in about 1 week (around 06/05/2024) for suture removal.  I, Andrea Kerns, CMA, am acting as scribe for Rexene Rattler, MD .   Documentation: I have reviewed the above documentation for accuracy and completeness, and I agree with the above.  Rexene Rattler, MD

## 2024-05-29 NOTE — Patient Instructions (Signed)

## 2024-05-30 ENCOUNTER — Telehealth: Payer: Self-pay

## 2024-05-30 NOTE — Telephone Encounter (Signed)
Talked to patient and she is doing fine from surgery yesterday.  

## 2024-06-01 LAB — SURGICAL PATHOLOGY

## 2024-06-05 ENCOUNTER — Ambulatory Visit: Payer: Self-pay | Admitting: Dermatology

## 2024-06-05 ENCOUNTER — Ambulatory Visit: Admitting: Dermatology

## 2024-06-05 ENCOUNTER — Encounter: Payer: Self-pay | Admitting: Dermatology

## 2024-06-05 DIAGNOSIS — L729 Follicular cyst of the skin and subcutaneous tissue, unspecified: Secondary | ICD-10-CM

## 2024-06-05 DIAGNOSIS — Z48817 Encounter for surgical aftercare following surgery on the skin and subcutaneous tissue: Secondary | ICD-10-CM

## 2024-06-05 NOTE — Patient Instructions (Signed)

## 2024-06-05 NOTE — Progress Notes (Signed)
   Follow-Up Visit   Subjective  Norma Austin is a 63 y.o. female who presents for the following: Cyst bx proven mid sternum, suture removal today   The following portions of the chart were reviewed this encounter and updated as appropriate: medications, allergies, medical history  Review of Systems:  No other skin or systemic complaints except as noted in HPI or Assessment and Plan.  Objective  Well appearing patient in no apparent distress; mood and affect are within normal limits.   A focused examination was performed of the following areas: chest  Relevant exam findings are noted in the Assessment and Plan.    Assessment & Plan   EPIDERMOID CYST Bx proven Mid sternum Exam: healing excision site  Treatment Plan: Encounter for Removal of Sutures - Incision site at the mid sternum is clean, dry and intact - Wound cleansed, sutures removed, wound cleansed and steri strips applied.  - Discussed pathology results showing inflamed epidermoid cyst  - Patient advised to keep steri-strips dry until they fall off. - Scars remodel for a full year. - Once steri-strips fall off, patient can apply over-the-counter silicone scar cream each night to help with scar remodeling if desired. - Patient advised to call with any concerns or if they notice any new or changing lesions.      Return if symptoms worsen or fail to improve.  I, Grayce Saunas, RMA, am acting as scribe for Rexene Rattler, MD .   Documentation: I have reviewed the above documentation for accuracy and completeness, and I agree with the above.  Rexene Rattler, MD

## 2024-07-10 ENCOUNTER — Encounter: Payer: Self-pay | Admitting: Oncology

## 2024-08-15 ENCOUNTER — Encounter: Payer: Self-pay | Admitting: Oncology

## 2024-08-22 ENCOUNTER — Encounter

## 2025-06-05 ENCOUNTER — Encounter: Admitting: Dermatology
